# Patient Record
Sex: Male | Born: 1945 | Race: White | Hispanic: No | Marital: Married | Smoking: Former smoker
Health system: Southern US, Community
[De-identification: ages and names within clinical notes are randomized; demographics above are authoritative.]

## PROBLEM LIST (undated history)

## (undated) DIAGNOSIS — G4730 Sleep apnea, unspecified: Secondary | ICD-10-CM

## (undated) DIAGNOSIS — I10 Essential (primary) hypertension: Secondary | ICD-10-CM

## (undated) DIAGNOSIS — N400 Enlarged prostate without lower urinary tract symptoms: Secondary | ICD-10-CM

## (undated) DIAGNOSIS — E785 Hyperlipidemia, unspecified: Secondary | ICD-10-CM

## (undated) HISTORY — PX: SHOULDER ARTHROSCOPY: SHX128

## (undated) HISTORY — PX: HERNIA REPAIR: SHX51

## (undated) HISTORY — PX: COLONOSCOPY: SHX174

## (undated) HISTORY — PX: CHOLECYSTECTOMY: SHX55

---

## 2000-07-12 ENCOUNTER — Encounter: Payer: Self-pay | Admitting: Neurosurgery

## 2000-07-12 ENCOUNTER — Ambulatory Visit (HOSPITAL_COMMUNITY): Admission: RE | Admit: 2000-07-12 | Discharge: 2000-07-12 | Payer: Self-pay | Admitting: Neurosurgery

## 2006-10-09 ENCOUNTER — Ambulatory Visit: Payer: Self-pay | Admitting: Orthopedic Surgery

## 2006-10-31 ENCOUNTER — Ambulatory Visit: Payer: Self-pay | Admitting: Orthopedic Surgery

## 2006-11-06 ENCOUNTER — Ambulatory Visit: Payer: Self-pay | Admitting: Orthopedic Surgery

## 2007-07-14 ENCOUNTER — Ambulatory Visit: Payer: Self-pay | Admitting: Orthopedic Surgery

## 2008-01-04 ENCOUNTER — Ambulatory Visit: Payer: Self-pay | Admitting: Family Medicine

## 2011-06-19 ENCOUNTER — Ambulatory Visit: Payer: Self-pay | Admitting: Otolaryngology

## 2011-07-16 ENCOUNTER — Ambulatory Visit: Payer: Self-pay | Admitting: Otolaryngology

## 2011-10-11 ENCOUNTER — Ambulatory Visit: Payer: Self-pay | Admitting: General Surgery

## 2013-01-28 ENCOUNTER — Ambulatory Visit: Payer: Self-pay | Admitting: Internal Medicine

## 2014-10-05 ENCOUNTER — Ambulatory Visit: Payer: Self-pay | Admitting: Internal Medicine

## 2014-10-14 ENCOUNTER — Emergency Department: Payer: Self-pay | Admitting: Emergency Medicine

## 2018-11-03 DIAGNOSIS — M7540 Impingement syndrome of unspecified shoulder: Secondary | ICD-10-CM | POA: Insufficient documentation

## 2018-11-22 ENCOUNTER — Other Ambulatory Visit: Payer: Self-pay

## 2018-11-22 ENCOUNTER — Emergency Department: Payer: Federal, State, Local not specified - PPO

## 2018-11-22 ENCOUNTER — Emergency Department
Admission: EM | Admit: 2018-11-22 | Discharge: 2018-11-22 | Disposition: A | Discharge status: Home / Self Care | Payer: Federal, State, Local not specified - PPO | Attending: Emergency Medicine | Admitting: Emergency Medicine

## 2018-11-22 ENCOUNTER — Encounter: Payer: Self-pay | Admitting: Emergency Medicine

## 2018-11-22 DIAGNOSIS — S0003XA Contusion of scalp, initial encounter: Secondary | ICD-10-CM

## 2018-11-22 DIAGNOSIS — Y999 Unspecified external cause status: Secondary | ICD-10-CM | POA: Insufficient documentation

## 2018-11-22 DIAGNOSIS — W01198A Fall on same level from slipping, tripping and stumbling with subsequent striking against other object, initial encounter: Secondary | ICD-10-CM | POA: Insufficient documentation

## 2018-11-22 DIAGNOSIS — Y9389 Activity, other specified: Secondary | ICD-10-CM | POA: Insufficient documentation

## 2018-11-22 DIAGNOSIS — Z7982 Long term (current) use of aspirin: Secondary | ICD-10-CM | POA: Insufficient documentation

## 2018-11-22 DIAGNOSIS — R52 Pain, unspecified: Secondary | ICD-10-CM | POA: Insufficient documentation

## 2018-11-22 DIAGNOSIS — Y929 Unspecified place or not applicable: Secondary | ICD-10-CM | POA: Insufficient documentation

## 2018-11-22 DIAGNOSIS — S50312A Abrasion of left elbow, initial encounter: Secondary | ICD-10-CM | POA: Diagnosis not present

## 2018-11-22 DIAGNOSIS — S0001XA Abrasion of scalp, initial encounter: Secondary | ICD-10-CM | POA: Insufficient documentation

## 2018-11-22 DIAGNOSIS — W19XXXA Unspecified fall, initial encounter: Secondary | ICD-10-CM

## 2018-11-22 DIAGNOSIS — S0990XA Unspecified injury of head, initial encounter: Secondary | ICD-10-CM | POA: Diagnosis present

## 2018-11-22 DIAGNOSIS — T07XXXA Unspecified multiple injuries, initial encounter: Secondary | ICD-10-CM

## 2018-11-22 DIAGNOSIS — Y92009 Unspecified place in unspecified non-institutional (private) residence as the place of occurrence of the external cause: Secondary | ICD-10-CM

## 2018-11-22 HISTORY — DX: Hyperlipidemia, unspecified: E78.5

## 2018-11-22 MED ORDER — BACITRACIN-NEOMYCIN-POLYMYXIN 400-5-5000 EX OINT
TOPICAL_OINTMENT | Freq: Once | CUTANEOUS | Status: AC
  Administered 2018-11-22: 19:00:00 via TOPICAL

## 2018-11-22 NOTE — Discharge Instructions (Signed)
Your exam and CT scan are normal at this time. Take Tylenol as needed for pain. Apply a small of antibiotic ointment to promote healing of the abrasions. Follow-up with your provider as needed.

## 2018-11-22 NOTE — ED Notes (Signed)
Neosporin applied to left elbow and bandaged over top, pt tolerated well.

## 2018-11-22 NOTE — ED Notes (Signed)
Pt verbalized understanding of d/c instructions and f/u care. No further questions at this time. Pt ambulatory to the exit with steady gait.  

## 2018-11-22 NOTE — ED Notes (Signed)
Pt up to restroom.

## 2018-11-22 NOTE — ED Triage Notes (Signed)
Pt to ED via POV c/o fall. Pt did hit his head, pt states that he does not think he passed out. Pt states that he is not on blood thinners but he does take a "10 mg baby aspirin" every 3 days. Pt is in NAD at this time.

## 2018-11-22 NOTE — ED Provider Notes (Signed)
[facility] Emergency Department Provider Note ____________________________________________  Time seen: 12  I have reviewed the triage vital signs and the nursing notes.  HISTORY  Chief Complaint  Fall  HPI Austin Hunter is a 73 y.o. male presents to the ED for evaluation of injury sustained following a mechanical fall.  Patient was in the ER playing with his 66-year-old grandson, when he apparently went to chase down a tennis ball.  He describes slipping and falling, falling primarily to the left side.  He complains of pain to the left elbow, the left buttocks, and to the back of the head.  He denies any loss of consciousness, but is concerned because he hit his head very hard.  He reports no interim syncope, vision change, nausea, vomiting, or weakness for several hours since the fall.  He presents now for further evaluation, at the urging of his brother-in-law.  Past Medical History:  Diagnosis Date  . Hyperlipemia     There are no active problems to display for this patient.   Past Surgical History:  Procedure Laterality Date  . CHOLECYSTECTOMY    . HERNIA REPAIR      Prior to Admission medications   Not on File    Allergies Ciprofloxacin and Codeine  No family history on file.  Social History Social History   Tobacco Use  . Smoking status: Never Smoker  . Smokeless tobacco: Never Used  Substance Use Topics  . Alcohol use: Not Currently    Frequency: Never  . Drug use: Not Currently    Review of Systems  Constitutional: Negative for fever. Eyes: Negative for visual changes. ENT: Negative for sore throat. Cardiovascular: Negative for chest pain. Respiratory: Negative for shortness of breath. Gastrointestinal: Negative for abdominal pain, vomiting and diarrhea. Genitourinary: Negative for dysuria. Musculoskeletal: Negative for back pain. Skin: Negative for rash.  Multiple abrasions as above. Neurological: Negative for focal  weakness or numbness.  Mild headache following fall. ____________________________________________  PHYSICAL EXAM:  VITAL SIGNS: ED Triage Vitals  Enc Vitals Group     BP 11/22/18 1812 (!) 190/100     Pulse Rate 11/22/18 1810 62     Resp 11/22/18 1810 16     Temp 11/22/18 1810 97.8 F (36.6 C)     Temp Source 11/22/18 1810 Oral     SpO2 11/22/18 1810 95 %     Weight 11/22/18 1809 210 lb (95.3 kg)     Height 11/22/18 1809 5\' 10"  (1.778 m)     Head Circumference --      Peak Flow --      Pain Score 11/22/18 1809 3     Pain Loc --      Pain Edu? --      Excl. in GC? --     Constitutional: Alert and oriented. Well appearing and in no distress.  GCS = 15 Head: Normocephalic and atraumatic, except for a small abrasion to the crown of the scalp.  No battle sign noted. Eyes: Conjunctivae are normal. PERRL. Normal extraocular movements Neck: Supple.  Normal range of motion without crepitus.  No distracting midline tenderness is elicited. Cardiovascular: Normal rate, regular rhythm. Normal distal pulses. Respiratory: Normal respiratory effort. No wheezes/rales/rhonchi. Gastrointestinal: Soft and nontender. No distention. Musculoskeletal: Nontender with normal range of motion in all extremities.  Neurologic: Cranial nerves II through XII grossly intact.  Normal intrinsic and opposition testing noted.  Normal gait without ataxia. Normal speech and language. No gross focal neurologic deficits are  appreciated. Skin:  Skin is warm, dry and intact. No rash noted. Psychiatric: Mood and affect are normal. Patient exhibits appropriate insight and judgment. ____________________________________________   RADIOLOGY  Head CT w/o CM  Negative ____________________________________________  PROCEDURES  Procedures Neosporin ointment  ____________________________________________  INITIAL IMPRESSION / ASSESSMENT AND PLAN / ED COURSE  Patient with ED evaluation of injuries following a mechanical  fall. He sustained a head contusion and scalp abrasion. He is reassured by his normal exam and negative CT scan. He is to see his provider for ongoing symptoms.  ____________________________________________  FINAL CLINICAL IMPRESSION(S) / ED DIAGNOSES  Final diagnoses:  Fall in home, initial encounter  Contusion of scalp, initial encounter  Abrasions of multiple sites      Michaela Corner 11/22/18 2007    Sharman Cheek, MD 11/22/18 2243

## 2018-11-22 NOTE — ED Notes (Signed)
Patient transported to CT 

## 2018-11-22 NOTE — ED Notes (Signed)
Pt returned from ct

## 2019-04-14 ENCOUNTER — Other Ambulatory Visit: Payer: Self-pay | Admitting: Orthopedic Surgery

## 2019-04-16 ENCOUNTER — Other Ambulatory Visit
Admission: RE | Admit: 2019-04-16 | Discharge: 2019-04-16 | Disposition: A | Discharge status: Home / Self Care | Payer: Federal, State, Local not specified - PPO | Source: Ambulatory Visit | Attending: Orthopedic Surgery | Admitting: Orthopedic Surgery

## 2019-04-16 ENCOUNTER — Other Ambulatory Visit: Payer: Self-pay

## 2019-04-16 DIAGNOSIS — Z01812 Encounter for preprocedural laboratory examination: Secondary | ICD-10-CM | POA: Diagnosis present

## 2019-04-16 DIAGNOSIS — Z20828 Contact with and (suspected) exposure to other viral communicable diseases: Secondary | ICD-10-CM | POA: Diagnosis not present

## 2019-04-17 LAB — SARS CORONAVIRUS 2 (TAT 6-24 HRS): SARS Coronavirus 2: NEGATIVE

## 2019-04-19 ENCOUNTER — Other Ambulatory Visit: Payer: Self-pay

## 2019-04-19 ENCOUNTER — Encounter
Admission: RE | Admit: 2019-04-19 | Discharge: 2019-04-19 | Disposition: A | Discharge status: Home / Self Care | Payer: Federal, State, Local not specified - PPO | Source: Ambulatory Visit | Attending: Orthopedic Surgery | Admitting: Orthopedic Surgery

## 2019-04-19 DIAGNOSIS — R001 Bradycardia, unspecified: Secondary | ICD-10-CM | POA: Insufficient documentation

## 2019-04-19 DIAGNOSIS — Z01818 Encounter for other preprocedural examination: Secondary | ICD-10-CM | POA: Diagnosis present

## 2019-04-19 DIAGNOSIS — I10 Essential (primary) hypertension: Secondary | ICD-10-CM | POA: Insufficient documentation

## 2019-04-19 HISTORY — DX: Essential (primary) hypertension: I10

## 2019-04-19 HISTORY — DX: Sleep apnea, unspecified: G47.30

## 2019-04-19 HISTORY — DX: Benign prostatic hyperplasia without lower urinary tract symptoms: N40.0

## 2019-04-19 LAB — BASIC METABOLIC PANEL WITH GFR
Anion gap: 10 (ref 5–15)
BUN: 22 mg/dL (ref 8–23)
CO2: 24 mmol/L (ref 22–32)
Calcium: 9.7 mg/dL (ref 8.9–10.3)
Chloride: 103 mmol/L (ref 98–111)
Creatinine, Ser: 1.04 mg/dL (ref 0.61–1.24)
GFR calc Af Amer: >60 mL/min
GFR calc non Af Amer: >60 mL/min
Glucose, Bld: 131 mg/dL — ABNORMAL HIGH (ref 70–99)
Potassium: 4 mmol/L (ref 3.5–5.1)
Sodium: 137 mmol/L (ref 135–145)

## 2019-04-19 LAB — CBC WITH DIFFERENTIAL/PLATELET
Abs Immature Granulocytes: 0.05 K/uL (ref 0.00–0.07)
Basophils Absolute: 0.1 K/uL (ref 0.0–0.1)
Basophils Relative: 1 %
Eosinophils Absolute: 0.2 K/uL (ref 0.0–0.5)
Eosinophils Relative: 2 %
HCT: 41.7 % (ref 39.0–52.0)
Hemoglobin: 13.7 g/dL (ref 13.0–17.0)
Immature Granulocytes: 1 %
Lymphocytes Relative: 27 %
Lymphs Abs: 1.9 K/uL (ref 0.7–4.0)
MCH: 31.1 pg (ref 26.0–34.0)
MCHC: 32.9 g/dL (ref 30.0–36.0)
MCV: 94.6 fL (ref 80.0–100.0)
Monocytes Absolute: 0.8 K/uL (ref 0.1–1.0)
Monocytes Relative: 11 %
Neutro Abs: 4.2 K/uL (ref 1.7–7.7)
Neutrophils Relative %: 58 %
Platelets: 268 K/uL (ref 150–400)
RBC: 4.41 MIL/uL (ref 4.22–5.81)
RDW: 13.8 % (ref 11.5–15.5)
WBC: 7.2 K/uL (ref 4.0–10.5)
nRBC: 0 % (ref 0.0–0.2)

## 2019-04-19 LAB — PROTIME-INR
INR: 1.1 (ref 0.8–1.2)
Prothrombin Time: 14.2 s (ref 11.4–15.2)

## 2019-04-19 LAB — APTT: aPTT: 27 s (ref 24–36)

## 2019-04-19 MED ORDER — CEFAZOLIN SODIUM-DEXTROSE 2-4 GM/100ML-% IV SOLN
2.0000 g | INTRAVENOUS | Status: AC
  Administered 2019-04-20: 2 g via INTRAVENOUS

## 2019-04-19 NOTE — Patient Instructions (Addendum)
Your procedure is scheduled on: 04/20/19 Report to [facility] at 12:30. To find out your arrival time please call [phone] between 1PM - 3PM on today .  Remember: Instructions that are not followed completely may result in serious medical risk, up to and including death, or upon the discretion of your surgeon and anesthesiologist your surgery may need to be rescheduled.     _X__ 1. Do not eat food after midnight the night before your procedure.                 No gum chewing or hard candies. You may drink clear liquids up to 2 hours                 before you are scheduled to arrive for your surgery- DO not drink clear                 liquids within 2 hours of the start of your surgery.                 Clear Liquids include:  water, apple juice without pulp, clear carbohydrate                 drink such as Clearfast or Gatorade, Black Coffee or Tea (Do not add                 anything to coffee or tea). Diabetics water only  __X__2.  On the morning of surgery brush your teeth with toothpaste and water, you                 may rinse your mouth with mouthwash if you wish.  Do not swallow any              toothpaste of mouthwash.     _X__ 3.  No Alcohol for 24 hours before or after surgery.   _X__ 4.  Do Not Smoke or use e-cigarettes For 24 Hours Prior to Your Surgery.                 Do not use any chewable tobacco products for at least 6 hours prior to                 surgery.  ____  5.  Bring all medications with you on the day of surgery if instructed.   __X__  6.  Notify your doctor if there is any change in your medical condition      (cold, fever, infections).     Do not wear jewelry, make-up, hairpins, clips or nail polish. Do not wear lotions, powders, or perfumes.  Do not shave 48 hours prior to surgery. Men may shave face and neck. Do not bring valuables to the hospital.    [facility] is not responsible for any  belongings or valuables.  Contacts, dentures/partials or body piercings may not be worn into surgery. Bring a case for your contacts, glasses or hearing aids, a denture cup will be supplied. Leave your suitcase in the car. After surgery it may be brought to your room. For patients admitted to the hospital, discharge time is determined by your treatment team.   Patients discharged the day of surgery will not be allowed to drive home.   Please read over the following fact sheets that you were given:   MRSA Information  __X__ Take these medicines the morning of surgery with A SIP OF  WATER:    1. none  2.   3.   4.  5.  6.  ____ Fleet Enema (as directed)   __X__ Use CHG Soap/SAGE wipes as directed  ____ Use inhalers on the day of surgery  ____ Stop metformin/Janumet/Farxiga 2 days prior to surgery    ____ Take 1/2 of usual insulin dose the night before surgery. No insulin the morning          of surgery.   ____ Stop Blood Thinners Coumadin/Plavix/Xarelto/Pleta/Pradaxa/Eliquis/Effient/Aspirin  on   Or contact your Surgeon, Cardiologist or Medical Doctor regarding  ability to stop your blood thinners  __X__ Stop Anti-inflammatories 7 days before surgery such as Advil, Ibuprofen, Motrin,  BC or Goodies Powder, Naprosyn, Naproxen, Aleve, Aspirin    __X__ Stop all herbal supplements, fish oil or vitamin E until after surgery.    ____ Bring C-Pap to the hospital.

## 2019-04-20 ENCOUNTER — Ambulatory Visit
Admission: RE | Admit: 2019-04-20 | Discharge: 2019-04-20 | Disposition: A | Discharge status: Home / Self Care | Payer: Federal, State, Local not specified - PPO | Source: Ambulatory Visit | Attending: Orthopedic Surgery | Admitting: Orthopedic Surgery

## 2019-04-20 ENCOUNTER — Ambulatory Visit: Payer: Federal, State, Local not specified - PPO

## 2019-04-20 ENCOUNTER — Ambulatory Visit: Payer: Federal, State, Local not specified - PPO | Admitting: Anesthesiology

## 2019-04-20 ENCOUNTER — Encounter: Payer: Self-pay | Admitting: *Deleted

## 2019-04-20 ENCOUNTER — Encounter
Admission: RE | Disposition: A | Discharge status: Home / Self Care | Payer: Self-pay | Source: Ambulatory Visit | Attending: Orthopedic Surgery

## 2019-04-20 DIAGNOSIS — M25812 Other specified joint disorders, left shoulder: Secondary | ICD-10-CM | POA: Diagnosis not present

## 2019-04-20 DIAGNOSIS — X58XXXA Exposure to other specified factors, initial encounter: Secondary | ICD-10-CM | POA: Diagnosis not present

## 2019-04-20 DIAGNOSIS — N400 Enlarged prostate without lower urinary tract symptoms: Secondary | ICD-10-CM | POA: Diagnosis not present

## 2019-04-20 DIAGNOSIS — Z885 Allergy status to narcotic agent status: Secondary | ICD-10-CM | POA: Insufficient documentation

## 2019-04-20 DIAGNOSIS — J449 Chronic obstructive pulmonary disease, unspecified: Secondary | ICD-10-CM | POA: Diagnosis not present

## 2019-04-20 DIAGNOSIS — S46112A Strain of muscle, fascia and tendon of long head of biceps, left arm, initial encounter: Secondary | ICD-10-CM | POA: Diagnosis not present

## 2019-04-20 DIAGNOSIS — E785 Hyperlipidemia, unspecified: Secondary | ICD-10-CM | POA: Insufficient documentation

## 2019-04-20 DIAGNOSIS — I10 Essential (primary) hypertension: Secondary | ICD-10-CM | POA: Diagnosis not present

## 2019-04-20 DIAGNOSIS — M19012 Primary osteoarthritis, left shoulder: Secondary | ICD-10-CM | POA: Insufficient documentation

## 2019-04-20 DIAGNOSIS — G4730 Sleep apnea, unspecified: Secondary | ICD-10-CM | POA: Insufficient documentation

## 2019-04-20 DIAGNOSIS — Z79899 Other long term (current) drug therapy: Secondary | ICD-10-CM | POA: Insufficient documentation

## 2019-04-20 DIAGNOSIS — Z87891 Personal history of nicotine dependence: Secondary | ICD-10-CM | POA: Insufficient documentation

## 2019-04-20 DIAGNOSIS — M75112 Incomplete rotator cuff tear or rupture of left shoulder, not specified as traumatic: Secondary | ICD-10-CM | POA: Insufficient documentation

## 2019-04-20 DIAGNOSIS — Z7982 Long term (current) use of aspirin: Secondary | ICD-10-CM | POA: Diagnosis not present

## 2019-04-20 DIAGNOSIS — Z881 Allergy status to other antibiotic agents status: Secondary | ICD-10-CM | POA: Diagnosis not present

## 2019-04-20 HISTORY — PX: SHOULDER ARTHROSCOPY WITH OPEN ROTATOR CUFF REPAIR: SHX6092

## 2019-04-20 SURGERY — ARTHROSCOPY, SHOULDER WITH REPAIR, ROTATOR CUFF, OPEN
Anesthesia: General | Site: Shoulder | Laterality: Left

## 2019-04-20 MED ORDER — BUPIVACAINE HCL (PF) 0.5 % IJ SOLN
INTRAMUSCULAR | Status: AC
  Filled 2019-04-20: qty 10

## 2019-04-20 MED ORDER — MIDAZOLAM HCL 2 MG/2ML IJ SOLN
1.0000 mg | Freq: Once | INTRAMUSCULAR | Status: AC
  Administered 2019-04-20: 13:00:00 1 mg via INTRAVENOUS

## 2019-04-20 MED ORDER — CHLORHEXIDINE GLUCONATE CLOTH 2 % EX PADS: 6.0000 | MEDICATED_PAD | Freq: Once | CUTANEOUS | Status: DC

## 2019-04-20 MED ORDER — OXYCODONE HCL 5 MG PO TABS: 5.0000 mg | ORAL_TABLET | Freq: Once | ORAL | Status: DC | PRN

## 2019-04-20 MED ORDER — BUPIVACAINE LIPOSOME 1.3 % IJ SUSP
INTRAMUSCULAR | Status: DC | PRN
  Administered 2019-04-20: 13 mL via PERINEURAL
  Administered 2019-04-20: 7 mL via PERINEURAL

## 2019-04-20 MED ORDER — EPINEPHRINE PF 1 MG/ML IJ SOLN
INTRAMUSCULAR | Status: AC
  Filled 2019-04-20: qty 4

## 2019-04-20 MED ORDER — MIDAZOLAM HCL 2 MG/2ML IJ SOLN
INTRAMUSCULAR | Status: AC
  Administered 2019-04-20: 1 mg via INTRAVENOUS
  Filled 2019-04-20: qty 2

## 2019-04-20 MED ORDER — FENTANYL CITRATE (PF) 100 MCG/2ML IJ SOLN
INTRAMUSCULAR | Status: AC
  Filled 2019-04-20: qty 2

## 2019-04-20 MED ORDER — FAMOTIDINE 20 MG PO TABS
ORAL_TABLET | ORAL | Status: AC
  Filled 2019-04-20: qty 1

## 2019-04-20 MED ORDER — PHENYLEPHRINE HCL (PRESSORS) 10 MG/ML IV SOLN
INTRAVENOUS | Status: AC
  Filled 2019-04-20: qty 1

## 2019-04-20 MED ORDER — FENTANYL CITRATE (PF) 100 MCG/2ML IJ SOLN: 25.0000 ug | INTRAMUSCULAR | Status: DC | PRN

## 2019-04-20 MED ORDER — FENTANYL CITRATE (PF) 100 MCG/2ML IJ SOLN
INTRAMUSCULAR | Status: DC | PRN
  Administered 2019-04-20 (×2): 25 ug via INTRAVENOUS
  Administered 2019-04-20: 50 ug via INTRAVENOUS

## 2019-04-20 MED ORDER — DEXAMETHASONE SODIUM PHOSPHATE 10 MG/ML IJ SOLN
INTRAMUSCULAR | Status: DC | PRN
  Administered 2019-04-20: 10 mg via INTRAVENOUS

## 2019-04-20 MED ORDER — DEXAMETHASONE SODIUM PHOSPHATE 10 MG/ML IJ SOLN
INTRAMUSCULAR | Status: AC
  Filled 2019-04-20: qty 1

## 2019-04-20 MED ORDER — ONDANSETRON HCL 4 MG/2ML IJ SOLN
INTRAMUSCULAR | Status: DC | PRN
  Administered 2019-04-20: 4 mg via INTRAVENOUS

## 2019-04-20 MED ORDER — SODIUM CHLORIDE 0.9 % IV SOLN
INTRAVENOUS | Status: DC | PRN
  Administered 2019-04-20: 50 ug/min via INTRAVENOUS

## 2019-04-20 MED ORDER — LACTATED RINGERS IV SOLN
INTRAVENOUS | Status: DC
  Administered 2019-04-20: 13:00:00 via INTRAVENOUS

## 2019-04-20 MED ORDER — FENTANYL CITRATE (PF) 100 MCG/2ML IJ SOLN
INTRAMUSCULAR | Status: AC
  Administered 2019-04-20: 50 ug via INTRAVENOUS
  Filled 2019-04-20: qty 2

## 2019-04-20 MED ORDER — ONDANSETRON HCL 4 MG/2ML IJ SOLN
INTRAMUSCULAR | Status: AC
  Filled 2019-04-20: qty 2

## 2019-04-20 MED ORDER — LIDOCAINE HCL (PF) 1 % IJ SOLN
INTRAMUSCULAR | Status: AC
  Filled 2019-04-20: qty 5

## 2019-04-20 MED ORDER — SUGAMMADEX SODIUM 200 MG/2ML IV SOLN
INTRAVENOUS | Status: AC
  Filled 2019-04-20: qty 2

## 2019-04-20 MED ORDER — FAMOTIDINE 20 MG PO TABS
20.0000 mg | ORAL_TABLET | Freq: Once | ORAL | Status: AC
  Administered 2019-04-20: 20 mg via ORAL

## 2019-04-20 MED ORDER — ONDANSETRON HCL 4 MG PO TABS: 4.0000 mg | ORAL_TABLET | Freq: Three times a day (TID) | ORAL | 0 refills | Status: AC | PRN

## 2019-04-20 MED ORDER — NEOMYCIN-POLYMYXIN B GU 40-200000 IR SOLN
Status: AC
  Filled 2019-04-20: qty 2

## 2019-04-20 MED ORDER — FENTANYL CITRATE (PF) 100 MCG/2ML IJ SOLN
50.0000 ug | Freq: Once | INTRAMUSCULAR | Status: AC
  Administered 2019-04-20: 13:00:00 50 ug via INTRAVENOUS

## 2019-04-20 MED ORDER — OXYCODONE HCL 5 MG/5ML PO SOLN: 5.0000 mg | Freq: Once | ORAL | Status: DC | PRN

## 2019-04-20 MED ORDER — PROPOFOL 10 MG/ML IV BOLUS
INTRAVENOUS | Status: AC
  Filled 2019-04-20: qty 20

## 2019-04-20 MED ORDER — BUPIVACAINE HCL (PF) 0.5 % IJ SOLN
INTRAMUSCULAR | Status: DC | PRN
  Administered 2019-04-20: 3 mL via PERINEURAL
  Administered 2019-04-20: 7 mL via PERINEURAL

## 2019-04-20 MED ORDER — LIDOCAINE HCL (CARDIAC) PF 100 MG/5ML IV SOSY
PREFILLED_SYRINGE | INTRAVENOUS | Status: DC | PRN
  Administered 2019-04-20: 100 mg via INTRAVENOUS

## 2019-04-20 MED ORDER — ROCURONIUM BROMIDE 50 MG/5ML IV SOLN
INTRAVENOUS | Status: AC
  Filled 2019-04-20: qty 1

## 2019-04-20 MED ORDER — GLYCOPYRROLATE 0.2 MG/ML IJ SOLN
INTRAMUSCULAR | Status: DC | PRN
  Administered 2019-04-20: 0.2 mg via INTRAVENOUS

## 2019-04-20 MED ORDER — BUPIVACAINE LIPOSOME 1.3 % IJ SUSP
INTRAMUSCULAR | Status: AC
  Filled 2019-04-20: qty 20

## 2019-04-20 MED ORDER — OXYCODONE HCL 5 MG PO TABS: 5.0000 mg | ORAL_TABLET | ORAL | 0 refills | Status: AC | PRN

## 2019-04-20 MED ORDER — PHENYLEPHRINE HCL (PRESSORS) 10 MG/ML IV SOLN
INTRAVENOUS | Status: DC | PRN
  Administered 2019-04-20: 100 ug via INTRAVENOUS

## 2019-04-20 MED ORDER — ROCURONIUM BROMIDE 100 MG/10ML IV SOLN
INTRAVENOUS | Status: DC | PRN
  Administered 2019-04-20: 50 mg via INTRAVENOUS
  Administered 2019-04-20: 20 mg via INTRAVENOUS

## 2019-04-20 MED ORDER — PROPOFOL 10 MG/ML IV BOLUS
INTRAVENOUS | Status: DC | PRN
  Administered 2019-04-20: 140 mg via INTRAVENOUS

## 2019-04-20 MED ORDER — SUGAMMADEX SODIUM 200 MG/2ML IV SOLN
INTRAVENOUS | Status: DC | PRN
  Administered 2019-04-20: 200 mg via INTRAVENOUS

## 2019-04-20 MED ORDER — ACETAMINOPHEN 10 MG/ML IV SOLN
INTRAVENOUS | Status: AC
  Filled 2019-04-20: qty 100

## 2019-04-20 MED ORDER — LIDOCAINE HCL (PF) 2 % IJ SOLN
INTRAMUSCULAR | Status: AC
  Filled 2019-04-20: qty 10

## 2019-04-20 MED ORDER — ACETAMINOPHEN 10 MG/ML IV SOLN
INTRAVENOUS | Status: DC | PRN
  Administered 2019-04-20: 1000 mg via INTRAVENOUS

## 2019-04-20 MED ORDER — CEFAZOLIN SODIUM-DEXTROSE 2-4 GM/100ML-% IV SOLN
INTRAVENOUS | Status: AC
  Filled 2019-04-20: qty 100

## 2019-04-20 SURGICAL SUPPLY — 75 items
ADAPTER IRRIG TUBE 2 SPIKE SOL (ADAPTER) ×4 IMPLANT
ADPR TBG 2 SPK PMP STRL ASCP (ADAPTER) ×2
ANCH SUT 5.5 KNTLS PEEK (Orthopedic Implant) ×3 IMPLANT
ANCH SUT Q-FX 2.8 (Anchor) ×2 IMPLANT
ANCHOR ALL-SUT Q-FIX 2.8 (Anchor) ×2 IMPLANT
ANCHOR SUT 5.5 MULTIFIX (Orthopedic Implant) ×3 IMPLANT
BUR RADIUS 4.0X18.5 (BURR) ×2 IMPLANT
BUR RADIUS 5.5 (BURR) ×2 IMPLANT
CANNULA 5.75X7 CRYSTAL CLEAR (CANNULA) ×4 IMPLANT
CANNULA PARTIAL THREAD 2X7 (CANNULA) ×2 IMPLANT
CANNULA TWIST IN 8.25X9CM (CANNULA) IMPLANT
CONNECTOR PERFECT PASSER (CONNECTOR) ×1 IMPLANT
COOLER POLAR GLACIER W/PUMP (MISCELLANEOUS) ×2 IMPLANT
COVER WAND RF STERILE (DRAPES) ×2 IMPLANT
CRADLE LAMINECT ARM (MISCELLANEOUS) ×2 IMPLANT
DEVICE SUCT BLK HOLE OR FLOOR (MISCELLANEOUS) ×1 IMPLANT
DRAPE 3/4 80X56 (DRAPES) ×2 IMPLANT
DRAPE INCISE IOBAN 66X45 STRL (DRAPES) ×2 IMPLANT
DRAPE SPLIT 6X30 W/TAPE (DRAPES) ×4 IMPLANT
DRAPE U-SHAPE 47X51 STRL (DRAPES) IMPLANT
DURAPREP 26ML APPLICATOR (WOUND CARE) ×6 IMPLANT
ELECT REM PT RETURN 9FT ADLT (ELECTROSURGICAL) ×2
ELECTRODE REM PT RTRN 9FT ADLT (ELECTROSURGICAL) ×1 IMPLANT
GAUZE SPONGE 4X4 12PLY STRL (GAUZE/BANDAGES/DRESSINGS) ×4 IMPLANT
GAUZE XEROFORM 1X8 LF (GAUZE/BANDAGES/DRESSINGS) ×2 IMPLANT
GLOVE BIOGEL PI IND STRL 9 (GLOVE) ×1 IMPLANT
GLOVE BIOGEL PI INDICATOR 9 (GLOVE) ×1
GLOVE SURG 9.0 ORTHO LTXF (GLOVE) ×4 IMPLANT
GOWN STRL REUS TWL 2XL XL LVL4 (GOWN DISPOSABLE) ×2 IMPLANT
GOWN STRL REUS W/ TWL LRG LVL3 (GOWN DISPOSABLE) ×1 IMPLANT
GOWN STRL REUS W/ TWL LRG LVL4 (GOWN DISPOSABLE) ×1 IMPLANT
GOWN STRL REUS W/TWL LRG LVL3 (GOWN DISPOSABLE) ×2
GOWN STRL REUS W/TWL LRG LVL4 (GOWN DISPOSABLE) ×2
IV LACTATED RINGER IRRG 3000ML (IV SOLUTION) ×14
IV LR IRRIG 3000ML ARTHROMATIC (IV SOLUTION) ×6 IMPLANT
KIT STABILIZATION SHOULDER (MISCELLANEOUS) ×2 IMPLANT
KIT SUTURE 2.8 Q-FIX DISP (MISCELLANEOUS) ×1 IMPLANT
KIT SUTURETAK 3.0 INSERT PERC (KITS) IMPLANT
KIT TURNOVER KIT A (KITS) ×2 IMPLANT
MANIFOLD NEPTUNE II (INSTRUMENTS) ×2 IMPLANT
MASK FACE SPIDER DISP (MASK) ×2 IMPLANT
MAT ABSORB  FLUID 56X50 GRAY (MISCELLANEOUS) ×2
MAT ABSORB FLUID 56X50 GRAY (MISCELLANEOUS) ×2 IMPLANT
NDL SAFETY ECLIPSE 18X1.5 (NEEDLE) ×1 IMPLANT
NEEDLE HYPO 18GX1.5 SHARP (NEEDLE) ×2
NEEDLE HYPO 22GX1.5 SAFETY (NEEDLE) ×2 IMPLANT
NS IRRIG 500ML POUR BTL (IV SOLUTION) ×2 IMPLANT
PACK ARTHROSCOPY SHOULDER (MISCELLANEOUS) ×2 IMPLANT
PAD WRAPON POLAR SHDR XLG (MISCELLANEOUS) ×1 IMPLANT
PASSER SUT CAPTURE FIRST (SUTURE) IMPLANT
PASSER SUT FIRSTPASS SELF (INSTRUMENTS) ×1 IMPLANT
SET TUBE SUCT SHAVER OUTFL 24K (TUBING) ×2 IMPLANT
SET TUBE TIP INTRA-ARTICULAR (MISCELLANEOUS) ×2 IMPLANT
STRAP SAFETY 5IN WIDE (MISCELLANEOUS) ×2 IMPLANT
STRIP CLOSURE SKIN 1/2X4 (GAUZE/BANDAGES/DRESSINGS) ×4 IMPLANT
SUT ETHILON 4-0 (SUTURE) ×2
SUT ETHILON 4-0 FS2 18XMFL BLK (SUTURE) ×1
SUT LASSO 90 DEG SD STR (SUTURE) IMPLANT
SUT MNCRL 4-0 (SUTURE) ×2
SUT MNCRL 4-0 27XMFL (SUTURE) ×1
SUT PDS AB 0 CT1 27 (SUTURE) ×2 IMPLANT
SUT PERFECTPASSER WHITE CART (SUTURE) ×4 IMPLANT
SUT SMART STITCH CARTRIDGE (SUTURE) ×4 IMPLANT
SUT VIC AB 0 CT1 36 (SUTURE) ×2 IMPLANT
SUT VIC AB 2-0 CT2 27 (SUTURE) ×2 IMPLANT
SUTURE ETHLN 4-0 FS2 18XMF BLK (SUTURE) ×1 IMPLANT
SUTURE MAGNUM WIRE 2X48 BLK (SUTURE) IMPLANT
SUTURE MNCRL 4-0 27XMF (SUTURE) ×1 IMPLANT
SYR 10ML LL (SYRINGE) ×2 IMPLANT
TAPE MICROFOAM 4IN (TAPE) ×2 IMPLANT
TUBING ARTHRO INFLOW-ONLY STRL (TUBING) ×2 IMPLANT
TUBING CONNECTING 10 (TUBING) ×2 IMPLANT
WAND HAND CNTRL MULTIVAC 90 (MISCELLANEOUS) ×2 IMPLANT
WIRE MAGNUM (SUTURE) ×1 IMPLANT
WRAPON POLAR PAD SHDR XLG (MISCELLANEOUS) ×2

## 2019-04-20 NOTE — Discharge Instructions (Signed)

## 2019-04-20 NOTE — H&P (Signed)
PREOPERATIVE H&P  Chief Complaint: M75.112 incomplete rotator cuff tear or rupture of left shoulder  HPI: Austin Hunter is a 73 y.o. male who presents for preoperative history and physical with a diagnosis of M75.112 incomplete rotator cuff tear or rupture of left shoulder. Symptoms of pain, weakness and limited ROM are significantly impairing activities of daily living.  He has failed non-op management with PT, NSAIDs and steroid injection.  His MRI shows a partial thickness rotator cuff tear.  He has agreed with surgical repair of the rotator cuff.   Past Medical History:  Diagnosis Date  . Enlarged prostate   . Hyperlipemia   . Hypertension   . Sleep apnea    Past Surgical History:  Procedure Laterality Date  . CHOLECYSTECTOMY    . COLONOSCOPY    . HERNIA REPAIR    . SHOULDER ARTHROSCOPY Left    Social History   Socioeconomic History  . Marital status: Married    Spouse name: Not on file  . Number of children: Not on file  . Years of education: Not on file  . Highest education level: Not on file  Occupational History  . Not on file  Social Needs  . Financial resource strain: Not on file  . Food insecurity    Worry: Not on file    Inability: Not on file  . Transportation needs    Medical: Not on file    Non-medical: Not on file  Tobacco Use  . Smoking status: Former Smoker    Quit date: 1985    Years since quitting: 35.6  . Smokeless tobacco: Never Used  Substance and Sexual Activity  . Alcohol use: Not Currently    Frequency: Never  . Drug use: Never  . Sexual activity: Not on file  Lifestyle  . Physical activity    Days per week: Not on file    Minutes per session: Not on file  . Stress: Not on file  Relationships  . Social Musicianconnections    Talks on phone: Not on file    Gets together: Not on file    Attends religious service: Not on file    Active member of club or organization: Not on file    Attends meetings of clubs or organizations: Not on file     Relationship status: Not on file  Other Topics Concern  . Not on file  Social History Narrative  . Not on file   History reviewed. No pertinent family history. Allergies  Allergen Reactions  . Ciprofloxacin Hives  . Codeine Nausea Only   Prior to Admission medications   Medication Sig Start Date End Date Taking? Authorizing Provider  acetaminophen (TYLENOL) 500 MG tablet Take 500 mg by mouth every 6 (six) hours as needed for moderate pain or headache.   Yes [provider]  aspirin EC 81 MG tablet Take 81 mg by mouth every 3 (three) days.   Yes [provider]  fenofibrate (TRICOR) 145 MG tablet Take 145 mg by mouth at bedtime.   Yes [provider]  ibuprofen (ADVIL) 200 MG tablet Take 200 mg by mouth every 6 (six) hours as needed for headache or moderate pain.   Yes [provider]  lisinopril (ZESTRIL) 10 MG tablet Take 10 mg by mouth daily.   Yes [provider]  Misc Natural Products (PROSTATE) CAPS Take 1 capsule by mouth 2 (two) times daily. Prosta-Strong   Yes [provider]  Omega-3 Fatty Acids (OMEGA 3 PO)  Take 1 capsule by mouth every other day.   Yes [provider]  OVER THE COUNTER MEDICATION Take 7 capsules by mouth daily. Melaleuca peek performance heart health vitamin package   Yes [provider]  OVER THE COUNTER MEDICATION Take 1 tablet by mouth at bedtime. Immuno shield otc supplement   Yes [provider]  tamsulosin (FLOMAX) 0.4 MG CAPS capsule Take 0.4 mg by mouth at bedtime.   Yes [provider]  Vitamin Mixture (ESTER-C PO) Take 1 tablet by mouth at bedtime.   Yes [provider]     Positive ROS: All other systems have been reviewed and were otherwise negative with the exception of those mentioned in the HPI and as above.  Physical Exam: General: Alert, no acute distress Cardiovascular: Regular rate and rhythm, no murmurs rubs or gallops.  No pedal  edema Respiratory: Clear to auscultation bilaterally, no wheezes rales or rhonchi. No cyanosis, no use of accessory musculature GI: No organomegaly, abdomen is soft and non-tender nondistended with positive bowel sounds. Skin: Skin intact, no lesions within the operative field. Neurologic: Sensation intact distally Psychiatric: Patient is competent for consent with normal mood and affect Lymphatic: No cervical lymphadenopathy  MUSCULOSKELETAL: Pain with forward elevation and abduction to 90 degrees.  His skin is intact.  There is no erythema ecchymosis or swelling.  Patient had an axillary rash without drainage or opening of the skin.  Patient has full digital wrist normal range of motion, intact sensation light touch and palpable pedal pulses.  Positive impingement signs but no apprehension or instability.  Patient had weakness of shoulder abduction and external rotation.  Assessment: M75.112 incomplete rotator cuff tear or rupture of left shoulder  Plan: Plan for Procedure(s): LEFT SHOULDER ARTHROSCOPIC SUBACROMIAL DECOMPRESSION, DISTAL CLAVICLE EXCISION AND MINI-OPEN ROTATOR CUFF REPAIR WITH POSSIBLE BICEPS TENOTOMY  I reviewed the details of the operation as well as the postoperative course with the patient.  He understands that his MRI shows a partial tear of the rotator cuff.  He has significant weakness pain and limited range of motion clinically.  The plan is to repair his partial rotator cuff tear and perform a subacromial decompression and distal clavicle excision.  His biceps tendon will be evaluated during surgery and a tenotomy versus tenodesis performed if necessary.  I discussed the risks and benefits of surgery.  The patient understands the risks include but are not limited to infection, bleeding, nerve or blood vessel injury, joint stiffness or loss of motion, persistent pain, weakness or instability, re-tear of the rotator cuff, failure of the repair and hardware failure and the  need for further surgery. Medical risks include but are not limited to DVT and pulmonary embolism, myocardial infarction, stroke, pneumonia, respiratory failure and death. Patient understood these risks and wished to proceed.     Thornton Park, MD   04/20/2019 2:14 PM

## 2019-04-20 NOTE — Anesthesia Procedure Notes (Signed)
Anesthesia Regional Block: Interscalene brachial plexus block   Pre-Anesthetic Checklist: ,, timeout performed, Correct Patient, Correct Site, Correct Laterality, Correct Procedure, Correct Position, site marked, Risks and benefits discussed,  Surgical consent,  Pre-op evaluation,  At surgeon's request and post-op pain management  Laterality: Upper and Left  Prep: chloraprep       Needles:  Injection technique: Single-shot  Needle Type: Stimiplex     Needle Length: 5cm  Needle Gauge: 22     Additional Needles:   Procedures:,,,, ultrasound used (permanent image in chart),,,,  Narrative:  Start time: 04/20/2019 1:30 PM End time: 04/20/2019 1:33 PM Injection made incrementally with aspirations every 5 mL.  Performed by: Personally  Anesthesiologist: Piscitello, Precious Haws, MD  Additional Notes: Patient consented for risk and benefits of nerve block including but not limited to nerve damage, failed block, bleeding and infection.  Patient voiced understanding.  Functioning IV was confirmed and monitors were applied.  A 41mm 22ga Stimuplex needle was used. Sterile prep,hand hygiene and sterile gloves were used.  Minimal sedation used for procedure.  No paresthesia endorsed by patient during the procedure.  Negative aspiration and negative test dose prior to incremental administration of local anesthetic. The patient tolerated the procedure well with no immediate complications.

## 2019-04-20 NOTE — Anesthesia Preprocedure Evaluation (Signed)
Anesthesia Evaluation  Patient identified by MRN, date of birth, ID band Patient awake    Reviewed: Allergy & Precautions, H&P , NPO status , Patient's Chart, lab work & pertinent test results  History of Anesthesia Complications Negative for: history of anesthetic complications  Airway Mallampati: II  TM Distance: >3 FB Neck ROM: limited    Dental  (+) Chipped, Poor Dentition   Pulmonary neg shortness of breath, sleep apnea and Continuous Positive Airway Pressure Ventilation , COPD, former smoker,           Cardiovascular Exercise Tolerance: Good hypertension, (-) angina(-) Past MI and (-) DOE      Neuro/Psych negative neurological ROS  negative psych ROS   GI/Hepatic negative GI ROS, Neg liver ROS, neg GERD  ,  Endo/Other  negative endocrine ROS  Renal/GU      Musculoskeletal   Abdominal   Peds  Hematology negative hematology ROS (+)   Anesthesia Other Findings Past Medical History: No date: Enlarged prostate No date: Hyperlipemia No date: Hypertension No date: Sleep apnea  Past Surgical History: No date: CHOLECYSTECTOMY No date: COLONOSCOPY No date: HERNIA REPAIR No date: SHOULDER ARTHROSCOPY; Left  BMI    Body Mass Index: 28.98 kg/m      Reproductive/Obstetrics negative OB ROS                             Anesthesia Physical Anesthesia Plan  ASA: III  Anesthesia Plan: General ETT   Post-op Pain Management: GA combined w/ Regional for post-op pain   Induction: Intravenous  PONV Risk Score and Plan: Ondansetron, Dexamethasone, Midazolam and Treatment may vary due to age or medical condition  Airway Management Planned: Oral ETT  Additional Equipment:   Intra-op Plan:   Post-operative Plan: Extubation in OR  Informed Consent: I have reviewed the patients History and Physical, chart, labs and discussed the procedure including the risks, benefits and alternatives  for the proposed anesthesia with the patient or authorized representative who has indicated his/her understanding and acceptance.     Dental Advisory Given  Plan Discussed with: Anesthesiologist, CRNA and Surgeon  Anesthesia Plan Comments: (Patient consented for risks of anesthesia including but not limited to:  - adverse reactions to medications - damage to teeth, lips or other oral mucosa - sore throat or hoarseness - Damage to heart, brain, lungs or loss of life  Patient voiced understanding.)        Anesthesia Quick Evaluation

## 2019-04-20 NOTE — Anesthesia Postprocedure Evaluation (Signed)
Anesthesia Post Note  Patient: Austin Hunter  Procedure(s) Performed: SHOULDER ARTHROSCOPY WITH OPEN ROTATOR CUFF REPAIR (Left Shoulder)  Patient location during evaluation: PACU Anesthesia Type: General Level of consciousness: awake and alert Pain management: pain level controlled Vital Signs Assessment: post-procedure vital signs reviewed and stable Respiratory status: spontaneous breathing, nonlabored ventilation, respiratory function stable and patient connected to nasal cannula oxygen Cardiovascular status: blood pressure returned to baseline and stable Postop Assessment: no apparent nausea or vomiting Anesthetic complications: no     Last Vitals:  Vitals:   04/20/19 1806 04/20/19 1821  BP: 123/66 133/77  Pulse: (!) 59 64  Resp: (!) 23 18  Temp:  (!) 36.3 C  SpO2: 93% 95%    Last Pain:  Vitals:   04/20/19 1821  TempSrc:   PainSc: 0-No pain                 Mickle Campton S

## 2019-04-20 NOTE — Anesthesia Procedure Notes (Signed)
Procedure Name: Intubation Date/Time: 04/20/2019 2:39 PM Performed by: Chanetta Marshall, CRNA Pre-anesthesia Checklist: Patient identified, Emergency Drugs available, Suction available and Patient being monitored Patient Re-evaluated:Patient Re-evaluated prior to induction Oxygen Delivery Method: Circle system utilized Preoxygenation: Pre-oxygenation with 100% oxygen Induction Type: IV induction Ventilation: Mask ventilation without difficulty Laryngoscope Size: McGraph and 4 Grade View: Grade II Tube type: Oral Tube size: 7.5 mm Number of attempts: 1 Airway Equipment and Method: Stylet and Oral airway Placement Confirmation: ETT inserted through vocal cords under direct vision,  positive ETCO2,  breath sounds checked- equal and bilateral and CO2 detector Secured at: 22 cm Tube secured with: Tape Dental Injury: Teeth and Oropharynx as per pre-operative assessment

## 2019-04-20 NOTE — Anesthesia Post-op Follow-up Note (Signed)
Anesthesia QCDR form completed.        

## 2019-04-20 NOTE — Transfer of Care (Signed)
Immediate Anesthesia Transfer of Care Note  Patient: Austin Hunter  Procedure(s) Performed: SHOULDER ARTHROSCOPY WITH OPEN ROTATOR CUFF REPAIR (Left Shoulder)  Patient Location: PACU  Anesthesia Type:General  Level of Consciousness: drowsy  Airway & Oxygen Therapy: Patient Spontanous Breathing and Patient connected to face mask oxygen  Post-op Assessment: Report given to RN and Post -op Vital signs reviewed and stable  Post vital signs: Reviewed and stable  Last Vitals:  Vitals Value Taken Time  BP 112/52 04/20/19 1737  Temp 36.4 C 04/20/19 1736  Pulse 59 04/20/19 1740  Resp 25 04/20/19 1740  SpO2 99 % 04/20/19 1740  Vitals shown include unvalidated device data.  Last Pain:  Vitals:   04/20/19 1328  TempSrc:   PainSc: 0-No pain      Patients Stated Pain Goal: 2 (27/06/23 7628)  Complications: No apparent anesthesia complications

## 2019-04-21 ENCOUNTER — Encounter: Payer: Self-pay | Admitting: Orthopedic Surgery

## 2019-04-23 ENCOUNTER — Other Ambulatory Visit: Payer: Self-pay

## 2019-04-23 ENCOUNTER — Ambulatory Visit
Admission: RE | Admit: 2019-04-23 | Discharge: 2019-04-23 | Disposition: A | Discharge status: Home / Self Care | Payer: Federal, State, Local not specified - PPO | Source: Ambulatory Visit | Attending: Physician Assistant | Admitting: Physician Assistant

## 2019-04-23 ENCOUNTER — Other Ambulatory Visit (HOSPITAL_COMMUNITY): Payer: Self-pay | Admitting: Physician Assistant

## 2019-04-23 ENCOUNTER — Other Ambulatory Visit: Payer: Self-pay | Admitting: Physician Assistant

## 2019-04-23 ENCOUNTER — Other Ambulatory Visit (HOSPITAL_COMMUNITY): Payer: Self-pay

## 2019-04-23 ENCOUNTER — Emergency Department
Admission: EM | Admit: 2019-04-23 | Discharge: 2019-04-23 | Disposition: A | Discharge status: Home / Self Care | Payer: Federal, State, Local not specified - PPO | Attending: Emergency Medicine | Admitting: Emergency Medicine

## 2019-04-23 DIAGNOSIS — I10 Essential (primary) hypertension: Secondary | ICD-10-CM | POA: Diagnosis not present

## 2019-04-23 DIAGNOSIS — Z79899 Other long term (current) drug therapy: Secondary | ICD-10-CM | POA: Insufficient documentation

## 2019-04-23 DIAGNOSIS — Z7982 Long term (current) use of aspirin: Secondary | ICD-10-CM | POA: Diagnosis not present

## 2019-04-23 DIAGNOSIS — I82431 Acute embolism and thrombosis of right popliteal vein: Secondary | ICD-10-CM | POA: Insufficient documentation

## 2019-04-23 DIAGNOSIS — M79661 Pain in right lower leg: Secondary | ICD-10-CM | POA: Diagnosis present

## 2019-04-23 DIAGNOSIS — Z87891 Personal history of nicotine dependence: Secondary | ICD-10-CM | POA: Diagnosis not present

## 2019-04-23 DIAGNOSIS — R2241 Localized swelling, mass and lump, right lower limb: Secondary | ICD-10-CM

## 2019-04-23 LAB — COMPREHENSIVE METABOLIC PANEL
ALT: 26 U/L (ref 0–44)
AST: 23 U/L (ref 15–41)
Albumin: 3.8 g/dL (ref 3.5–5.0)
Alkaline Phosphatase: 30 U/L — ABNORMAL LOW (ref 38–126)
Anion gap: 10 (ref 5–15)
BUN: 21 mg/dL (ref 8–23)
CO2: 25 mmol/L (ref 22–32)
Calcium: 9.2 mg/dL (ref 8.9–10.3)
Chloride: 100 mmol/L (ref 98–111)
Creatinine, Ser: 1.23 mg/dL (ref 0.61–1.24)
GFR calc Af Amer: >60 mL/min (ref >60)
GFR calc non Af Amer: 57 mL/min — ABNORMAL LOW (ref >60)
Glucose, Bld: 236 mg/dL — ABNORMAL HIGH (ref 70–99)
Potassium: 4.4 mmol/L (ref 3.5–5.1)
Sodium: 135 mmol/L (ref 135–145)
Total Bilirubin: 0.9 mg/dL (ref 0.3–1.2)
Total Protein: 7.4 g/dL (ref 6.5–8.1)

## 2019-04-23 LAB — CBC
HCT: 41.3 % (ref 39.0–52.0)
Hemoglobin: 13.6 g/dL (ref 13.0–17.0)
MCH: 30.8 pg (ref 26.0–34.0)
MCHC: 32.9 g/dL (ref 30.0–36.0)
MCV: 93.4 fL (ref 80.0–100.0)
Platelets: 234 10*3/uL (ref 150–400)
RBC: 4.42 MIL/uL (ref 4.22–5.81)
RDW: 13.7 % (ref 11.5–15.5)
WBC: 11.2 10*3/uL — ABNORMAL HIGH (ref 4.0–10.5)
nRBC: 0 % (ref 0.0–0.2)

## 2019-04-23 MED ORDER — RIVAROXABAN 15 MG PO TABS
15.0000 mg | ORAL_TABLET | Freq: Once | ORAL | Status: AC
  Administered 2019-04-23: 15 mg via ORAL
  Filled 2019-04-23: qty 1

## 2019-04-23 MED ORDER — RIVAROXABAN (XARELTO) VTE STARTER PACK (15 & 20 MG): ORAL_TABLET | ORAL | 0 refills | Status: AC

## 2019-04-23 NOTE — ED Triage Notes (Signed)
Right lower leg cramping, had outpatient [place] and showed blood clot. Pt recent left rotator cuff surgery. Pt alert and oriented X4, cooperative, RR even and unlabored, color WNL. Pt in NAD.

## 2019-04-23 NOTE — ED Provider Notes (Signed)
[facility] Emergency Department Provider Note   ____________________________________________    I have reviewed the triage vital signs and the nursing notes.   HISTORY  Chief Complaint Other (Blood Clot)     HPI Austin Hunter is a 73 y.o. male who developed right-sided leg pain this morning which has been constant.  Patient had shoulder surgery 3 days ago with Dr. Lesleigh NoeKosinski.  He reports cramping pain in his right calf primarily which is moderate in intensity.  No radiation.  Is not take anything for this.  No shortness of breath or pleurisy  Past Medical History:  Diagnosis Date  . Enlarged prostate   . Hyperlipemia   . Hypertension   . Sleep apnea     There are no active problems to display for this patient.   Past Surgical History:  Procedure Laterality Date  . CHOLECYSTECTOMY    . COLONOSCOPY    . HERNIA REPAIR    . SHOULDER ARTHROSCOPY Left   . SHOULDER ARTHROSCOPY WITH OPEN ROTATOR CUFF REPAIR Left 04/20/2019   Procedure: SHOULDER ARTHROSCOPY WITH OPEN ROTATOR CUFF REPAIR;  Surgeon: Juanell FairlyKrasinski, Kevin, MD;  Location: [facility] ORS;  Service: Orthopedics;  Laterality: Left;    Prior to Admission medications   Medication Sig Start Date End Date Taking? Authorizing Provider  acetaminophen (TYLENOL) 500 MG tablet Take 500 mg by mouth every 6 (six) hours as needed for moderate pain or headache.    [provider]  aspirin EC 81 MG tablet Take 81 mg by mouth every 3 (three) days.    [provider]  fenofibrate (TRICOR) 145 MG tablet Take 145 mg by mouth at bedtime.    [provider]  lisinopril (ZESTRIL) 10 MG tablet Take 10 mg by mouth daily.    [provider]  Misc Natural Products (PROSTATE) CAPS Take 1 capsule by mouth 2 (two) times daily. Prosta-Strong    [provider]  Omega-3 Fatty Acids (OMEGA 3 PO) Take 1 capsule by mouth every other day.    [provider]  ondansetron (ZOFRAN)  4 MG tablet Take 1 tablet (4 mg total) by mouth every 8 (eight) hours as needed for nausea or vomiting. 04/20/19   Juanell FairlyKrasinski, Kevin, MD  OVER THE COUNTER MEDICATION Take 7 capsules by mouth daily. Melaleuca peek performance heart health vitamin package    [provider]  OVER THE COUNTER MEDICATION Take 1 tablet by mouth at bedtime. Immuno shield otc supplement    [provider]  oxyCODONE (OXY IR/ROXICODONE) 5 MG immediate release tablet Take 1 tablet (5 mg total) by mouth every 4 (four) hours as needed. 04/20/19   Juanell FairlyKrasinski, Kevin, MD  Rivaroxaban 15 & 20 MG TBPK Take as directed on package: Start with one 15mg  tablet by mouth twice a day with food. On Day 22, switch to one 20mg  tablet once a day with food. 04/23/19   Jene EveryKinner, Eleasha Cataldo, MD  tamsulosin (FLOMAX) 0.4 MG CAPS capsule Take 0.4 mg by mouth at bedtime.    [provider]  Vitamin Mixture (ESTER-C PO) Take 1 tablet by mouth at bedtime.    [provider]     Allergies Ciprofloxacin and Codeine  No family history on file.  Social History Social History   Tobacco Use  . Smoking status: Former Smoker    Quit date: 1985    Years since quitting: 35.6  . Smokeless tobacco: Never Used  Substance Use Topics  . Alcohol use: Not Currently  Frequency: Never  . Drug use: Never    Review of Systems  Constitutional: No fever/chills  Cardiovascular: Denies chest pain.  No pleurisy Respiratory: Denies shortness of breath. Gastrointestinal: No abdominal pain.    Genitourinary: Negative for dysuria. Musculoskeletal: As above Skin: Negative for rash. Neurological: Negative for headaches   ____________________________________________   PHYSICAL EXAM:  VITAL SIGNS: ED Triage Vitals  Enc Vitals Group     BP 04/23/19 1710 (!) 145/66     Pulse Rate 04/23/19 1710 68     Resp 04/23/19 1710 18     Temp 04/23/19 1710 97.8 F (36.6 C)     Temp Source 04/23/19 1710 Oral     SpO2 04/23/19 1710 94 %      Weight 04/23/19 1711 91 kg (200 lb 9.9 oz)     Height 04/23/19 1711 1.778 m (5\' 10" )     Head Circumference --      Peak Flow --      Pain Score 04/23/19 1710 6     Pain Loc --      Pain Edu? --      Excl. in [place]? --     Constitutional: Alert and oriented.  .   Neck:  Painless ROM Cardiovascular: Normal rate, regular rhythm. Grossly normal heart sounds.  Good peripheral circulation. Respiratory: Normal respiratory effort.  No retractions. Lungs CTAB. Gastrointestinal: Soft and nontender. No distention.    Musculoskeletal: Patient with mild swelling to the right calf, no erythema, warm and well-perfused Neurologic:  Normal speech and language. No gross focal neurologic deficits are appreciated.  Skin:  Skin is warm, dry and intact. No rash noted. Psychiatric: Mood and affect are normal. Speech and behavior are normal.  ____________________________________________   LABS (all labs ordered are listed, but only abnormal results are displayed)  Labs Reviewed  CBC - Abnormal; Notable for the following components:      Result Value   WBC 11.2 (*)    All other components within normal limits  COMPREHENSIVE METABOLIC PANEL - Abnormal; Notable for the following components:   Glucose, Bld 236 (*)    Alkaline Phosphatase 30 (*)    GFR calc non Af Amer 57 (*)    All other components within normal limits   ____________________________________________  EKG  None ____________________________________________  RADIOLOGY  Ultrasound reviewed, popliteal DVT, no femoral DVT ____________________________________________   PROCEDURES  Procedure(s) performed: No  Procedures   Critical Care performed: No ____________________________________________   INITIAL IMPRESSION / ASSESSMENT AND PLAN / ED COURSE  Pertinent labs & imaging results that were available during my care of the patient were reviewed by me and considered in my medical decision making (see chart for details).   Patient with DVT as described in ultrasound report.  Lab work is reassuring.  Discussed with Dr. Harlow Mares orthopedic surgery who reports it is appropriate to start blood thinners given 3 days out from surgery.  We will start him on Xarelto.  Outpatient follow-up with PCP.  Return precautions discussed.  Blood thinner precautions discussed    ____________________________________________   FINAL CLINICAL IMPRESSION(S) / ED DIAGNOSES  Final diagnoses:  Acute deep vein thrombosis (DVT) of popliteal vein of right lower extremity ([place])        Note:  This document was prepared using Dragon voice recognition software and may include unintentional dictation errors.   Lavonia Drafts, MD 04/23/19 2340

## 2019-04-23 NOTE — ED Notes (Signed)
Assumed care of patient reports pain to left calf. Pat s/p rotator cuff surgery this week, reports pain since surgery today worse.

## 2019-04-30 NOTE — Op Note (Signed)
04/20/2019  6:39 PM  PATIENT:  Austin Hunter  73 y.o. male  PRE-OPERATIVE DIAGNOSIS:  M75.112 incomplete rotator cuff tear or rupture of left shoulder  POST-OPERATIVE DIAGNOSIS:  Incomplete rotator cuff tear or rupture of left shoulder, subacromial impingement, acromioclavicular arthrosis  PROCEDURE:  Procedure(s): LEFT SHOULDER ARTHROSCOPIC SUBACROMIAL DECOMPRESSION, DISTAL CLAVICLE EXCISION, BICEPS TENOTOMY WITH OPEN ROTATOR CUFF REPAIR (Left)  SURGEON:  Surgeon(s) and Role:    Thornton Park, MD - Primary  ANESTHESIA:   general and paracervical block   PREOPERATIVE INDICATIONS:  Austin Hunter is a  73 y.o. male with a diagnosis of M75.112 incomplete rotator cuff tear or rupture of left shoulder failed conservative treatment and elected for surgical management.    The risks benefits and alternatives were discussed with the patient preoperatively including but not limited to the risks of infection, bleeding, nerve injury, persistent pain or weakness, failure of the hardware, re-tear of the rotator cuff and the need for further surgery. Medical risks include DVT and pulmonary embolism, myocardial infarction, stroke, pneumonia, respiratory failure and death. Patient understood these risks and wished to proceed.  OPERATIVE IMPLANTS: [place] Multifix anchors x 3 & Smith and Nephew Q Fix anchors x 2  OPERATIVE FINDINGS: High grade partial tear of the biceps tendon, partial thickness tear of the supraspinatus, subacromial impingement and bursitis and acromioclavicular joint arthrosis  OPERATIVE PROCEDURE: The patient was met in the preoperative area. The left shoulder was signed with the word yes and my initials according the hospital's correct site of surgery protocol.   A pre-op history and physical was performed at the bedside.   Patient underwent an interscalene block with Exparel by the anesthesia service.  Patient was brought to the operating room where he underwent general  anesthesia.  The patient was placed in a beachchair position.  A spider arm positioner was used for this case. Examination under anesthesia revealed Hunter limitation of motion or instability with load shift testing. The patient had a negative sulcus sign.  Patient was prepped and draped in a sterile fashion. A timeout was performed to verify the patient's name, date of birth, medical record number, correct site of surgery and correct procedure to be performed there was also used to verify the patient received antibiotics that all appropriate instruments, implants and radiographs studies were available in the room. Once all in attendance were in agreement case began.  Bony landmarks were drawn out with a surgical marker along with proposed arthroscopy incisions. These were pre-injected with 1% lidocaine plain. An 11 blade was used to establish a posterior portal through which the arthroscope was placed in the glenohumeral joint. A full diagnostic examination of the shoulder was performed.  Patient was found to have a high grade partial tear of the biceps tendon and a tenotomy was performed with an Arthrocare wand. An 18-gauge spinal needle was used to place a 0 PDS suture through the rotator cuff tear for identification from the bursal side.  The arthroscope was then placed in the subacromial space.  Extensive bursitis was encountered and debrided using a 4-0 resector shaver blade and a 90 ArthroCare wand from a lateral portal which was established under direct visualization using an 18-gauge spinal needle. A subacromial decompression was also performed using a 5.5 mm resector shaver blade from the lateral portal. The 0 PDS suture was identified.  The partial-thickness tear was then completed with a 4.0 mm resector shaver blade.  Four Perfect Pass suture were placed in the  lateral border of the rotator cuff tear. All arthroscopic instruments were then removed and the mini-open portion of the procedure  began.   A saber-type incision was made along the lateral border of the acromion. The deltoid muscle was identified and split in line with its fibers which allowed visualization of the rotator cuff. The Perfect Pass sutures previously placed in the lateral border of the rotator cuff were brought out through the deltoid split.  Two additional Perfect Pass sutures were placed in the lateral border of the rotator cuff.  A 5.5 mm resector shaver blade was then used to debride the greater tuberosity of all torn fibers of the rotator cuff.  Two Smith and Nephew Q Fix anchors were placed at the articular margin of the humeral head with the greater tuberosity. The suture limbs of the Q Fix anchor were passed medially through the rotator cuff using a First Pass suture passer.   The 6 Perfect Pass sutures were then anchored to the greater tuberosity footprint using three Smith& Nephew Multifix anchors. These anchors were tensioned to allow for anatomic reduction of the rotator cuff to the greater tuberosity. The medial row sutures were then tied down using an arthroscopic knot tying technique.  Arthroscopic images of the repair were taken with the arthroscope both externally and from inside the glenohumeral joint.  All incisions were copiously irrigated. The deltoid fascia was repaired using a 0 Vicryl suture.  The subcutaneous tissue of all incisions were closed with a 2-0 Vicryl. Skin closure for the arthroscopic incisions was performed with 4-0 nylon. The skin edges of the saber incision was approximated with a running 4-0 undyed Monocryl.  A dry sterile dressing was applied.  The patient was placed in an abduction sling and a Polar Care was applied to the shoulder.  All sharp and it instrument counts were correct at the conclusion of the case. I was scrubbed and present for the entire case. I spoke with the patient's wife postoperatively to let her know the case had been performed without complication and the  patient was stable in recovery room. 

## 2019-05-07 ENCOUNTER — Ambulatory Visit (INDEPENDENT_AMBULATORY_CARE_PROVIDER_SITE_OTHER): Payer: Federal, State, Local not specified - PPO | Admitting: Vascular Surgery

## 2019-05-07 ENCOUNTER — Other Ambulatory Visit (INDEPENDENT_AMBULATORY_CARE_PROVIDER_SITE_OTHER): Payer: Self-pay | Admitting: Vascular Surgery

## 2019-05-07 ENCOUNTER — Other Ambulatory Visit: Payer: Self-pay

## 2019-05-07 ENCOUNTER — Ambulatory Visit (INDEPENDENT_AMBULATORY_CARE_PROVIDER_SITE_OTHER): Payer: Federal, State, Local not specified - PPO

## 2019-05-07 ENCOUNTER — Encounter (INDEPENDENT_AMBULATORY_CARE_PROVIDER_SITE_OTHER): Payer: Self-pay | Admitting: Vascular Surgery

## 2019-05-07 VITALS — BP 136/85 | HR 60 | Resp 16 | Wt 201.0 lb

## 2019-05-07 DIAGNOSIS — I10 Essential (primary) hypertension: Secondary | ICD-10-CM

## 2019-05-07 DIAGNOSIS — I82431 Acute embolism and thrombosis of right popliteal vein: Secondary | ICD-10-CM | POA: Diagnosis not present

## 2019-05-07 DIAGNOSIS — M7542 Impingement syndrome of left shoulder: Secondary | ICD-10-CM | POA: Diagnosis not present

## 2019-05-07 DIAGNOSIS — I82409 Acute embolism and thrombosis of unspecified deep veins of unspecified lower extremity: Secondary | ICD-10-CM | POA: Insufficient documentation

## 2019-05-07 NOTE — Progress Notes (Signed)
Patient ID: Austin Hunter Teeple, male   DOB: Jul 14, 1946, 73 y.o.   MRN: [id number]  Chief Complaint  Patient presents with   New Patient (Initial Visit)    ref Martha ClanKrasinski for le dvt    HPI Austin Hunter Cogar is a 73 y.o. male.  I am asked to see the patient by Dr. Martha ClanKrasinski for evaluation of DVT.  A couple of weeks ago, the patient underwent left shoulder surgery.  A few days later, he had pain and swelling in his right leg and was found to have a DVT in the peroneal and popliteal vein on a hospital ultrasound.  He was started on Xarelto within a few days his leg pain and swelling resolved.  He did have an allergic reaction to the Xarelto and developed severe itching and rash throughout his body.  He was even started on a steroid Dosepak which helped the allergy somewhat, but it did not resolve.  He was switched appropriately to Eliquis last week and the rash is getting worse and is actually as bad as it has been again.  He is referred today and we repeated an ultrasound which demonstrates resolution of the right lower extremity DVT with no thrombus seen today.  He has had no bleeding issues.  He has no fever or chills.     Past Medical History:  Diagnosis Date   Enlarged prostate    Hyperlipemia    Hypertension    Sleep apnea     Past Surgical History:  Procedure Laterality Date   CHOLECYSTECTOMY     COLONOSCOPY     HERNIA REPAIR     SHOULDER ARTHROSCOPY Left    SHOULDER ARTHROSCOPY WITH OPEN ROTATOR CUFF REPAIR Left 04/20/2019   Procedure: SHOULDER ARTHROSCOPY WITH OPEN ROTATOR CUFF REPAIR;  Surgeon: Juanell FairlyKrasinski, Kevin, MD;  Location: [facility];  Service: Orthopedics;  Laterality: Left;    Family History Family History  Problem Relation Age of Onset   Heart disease Mother    Cancer Mother    COPD Mother    Heart disease Father   no bleeding or clotting disorders  Social History Social History   Tobacco Use   Smoking status: Former Smoker    Quit date: 1985   Years since quitting: 35.7   Smokeless tobacco: Never Used  Substance Use Topics   Alcohol use: Not Currently    Frequency: Never   Drug use: Never    Allergies  Allergen Reactions   Ciprofloxacin Hives   Codeine Nausea Only    Current Outpatient Medications  Medication Sig Dispense Refill   acetaminophen (TYLENOL) 500 MG tablet Take 500 mg by mouth every 6 (six) hours as needed for moderate pain or headache.     aspirin EC 81 MG tablet Take 81 mg by mouth every 3 (three) days.     ELIQUIS 5 MG TABS tablet      fenofibrate (TRICOR) 145 MG tablet Take 145 mg by mouth at bedtime.     lisinopril (ZESTRIL) 10 MG tablet Take 10 mg by mouth daily.     Misc Natural Products (PROSTATE) CAPS Take 1 capsule by mouth 2 (two) times daily. Prosta-Strong     Omega-3 Fatty Acids (OMEGA 3 PO) Take 1 capsule by mouth every other day.     ondansetron (ZOFRAN) 4 MG tablet Take 1 tablet (4 mg total) by mouth every 8 (eight) hours as needed for nausea or vomiting. 30 tablet 0   OVER THE COUNTER MEDICATION Take 7  capsules by mouth daily. Melaleuca peek performance heart health vitamin package     OVER THE COUNTER MEDICATION Take 1 tablet by mouth at bedtime. Immuno shield otc supplement     tamsulosin (FLOMAX) 0.4 MG CAPS capsule Take 0.4 mg by mouth at bedtime.     Vitamin Mixture (ESTER-C PO) Take 1 tablet by mouth at bedtime.     oxyCODONE (OXY IR/ROXICODONE) 5 MG immediate release tablet Take 1 tablet (5 mg total) by mouth every 4 (four) hours as needed. (Patient not taking: Reported on 05/07/2019) 40 tablet 0   Rivaroxaban 15 & 20 MG TBPK Take as directed on package: Start with one 15mg  tablet by mouth twice a day with food. On Day 22, switch to one 20mg  tablet once a day with food. (Patient not taking: Reported on 05/07/2019) 51 each 0   No current facility-administered medications for this visit.       REVIEW OF SYSTEMS (Negative unless checked)  Constitutional: [] Weight loss   [] Fever  [] Chills Cardiac: [] Chest pain   [] Chest pressure   [] Palpitations   [] Shortness of breath when laying flat   [] Shortness of breath at rest   [] Shortness of breath with exertion. Vascular:  [] Pain in legs with walking   [] Pain in legs at rest   [] Pain in legs when laying flat   [] Claudication   [] Pain in feet when walking  [] Pain in feet at rest  [] Pain in feet when laying flat   [] History of DVT   [x] Phlebitis   [x] Swelling in legs   [] Varicose veins   [] Non-healing ulcers Pulmonary:   [] Uses home oxygen   [] Productive cough   [] Hemoptysis   [] Wheeze  [] COPD   [] Asthma Neurologic:  [] Dizziness  [] Blackouts   [] Seizures   [] History of stroke   [] History of TIA  [] Aphasia   [] Temporary blindness   [] Dysphagia   [] Weakness or numbness in arms   [] Weakness or numbness in legs Musculoskeletal:  [x] Arthritis   [] Joint swelling   [] Joint pain   [] Low back pain Hematologic:  [] Easy bruising  [] Easy bleeding   [] Hypercoagulable state   [] Anemic  [] Hepatitis Gastrointestinal:  [] Blood in stool   [] Vomiting blood  [] Gastroesophageal reflux/heartburn   [] Abdominal pain Genitourinary:  [] Chronic kidney disease   [] Difficult urination  [] Frequent urination  [] Burning with urination   [] Hematuria Skin:  [] Rashes   [] Ulcers   [] Wounds Psychological:  [] History of anxiety   []  History of major depression.    Physical Exam BP 136/85 (BP Location: Right Arm)    Pulse 60    Resp 16    Wt 201 lb (91.2 kg)    BMI 28.84 kg/m  Gen:  WD/WN, NAD Head: [place]/AT, No temporalis wasting.  Ear/Nose/Throat: Hearing grossly intact, nares w/o erythema or drainage, oropharynx w/o Erythema/Exudate Eyes: Conjunctiva clear, sclera non-icteric  Neck: trachea midline.  No JVD.  Pulmonary:  Good air movement, respirations not labored, no use of accessory muscles  Cardiac: RRR, no JVD Vascular:  Vessel Right Left  Radial Palpable Palpable                                   Gastrointestinal:. No masses, surgical  incisions, or scars. Musculoskeletal: M/S 5/5 throughout.  Extremities without ischemic changes.  No deformity or atrophy.  No lower extremity edema. Neurologic: Sensation grossly intact in extremities.  Symmetrical.  Speech is fluent. Motor exam as listed above. Psychiatric: Judgment  intact, Mood & affect appropriate for pt's clinical situation. Dermatologic: Raised red rash particularly prominent in the trunk but also seen on the arms and legs.    Radiology US Venous Img Lower Unilateral Right  Result Date: 04/23/2019 CLINICAL DATA:  73 year old male with right lower extremity swelling EXAM: RIGHT LOWER EXTREMITY VENOUS DOPPLER ULTRASOUND TECHNIQUE: Gray-scale sonography with graded compression, as well as color Doppler and duplex ultrasound were performed to evaluate the lower extremity deep venous systems from the level of the common femoral vein and including the common femoral, femoral, profunda femoral, popliteal and calf veins including the posterior tibial, peroneal and gastrocnemius veins when visible. The superficial great saphenous vein was also interrogated. Spectral Doppler was utilized to evaluate flow at rest and with distal augmentation maneuvers in the common femoral, femoral and popliteal veins. COMPARISON:  None. FINDINGS: Contralateral Common Femoral Vein: Respiratory phasicity is normal and symmetric with the symptomatic side. No evidence of thrombus. Normal compressibility. Common Femoral Vein: No evidence of thrombus. Normal compressibility, respiratory phasicity and response to augmentation. Saphenofemoral Junction: No evidence of thrombus. Normal compressibility and flow on color Doppler imaging. Profunda Femoral Vein: No evidence of thrombus. Normal compressibility and flow on color Doppler imaging. Femoral Vein: No evidence of thrombus. Normal compressibility, respiratory phasicity and response to augmentation. Popliteal Vein: Not compressible. The lumen is expanded and filled  with low-level internal echoes. Minimal flow visualized on color Doppler imaging. Calf Veins: Occlusive thrombus throughout the posterior tibial and peroneal veins. Superficial Great Saphenous Vein: No evidence of thrombus. Normal compressibility. Venous Reflux:  None. Other Findings:  None. IMPRESSION: Positive for acute occlusive DVT in the popliteal and calf veins. These results will be called to the ordering clinician or representative by the Radiologist Assistant, and communication documented in the PACS or zVision Dashboard. Electronically Signed   By: Jacqulynn Cadet M.D.   On: 04/23/2019 16:48   [place] Or Nerve Block-image Only ([facility])  Result Date: 04/20/2019 There is no interpretation for this exam.  This order is for images obtained during a surgical procedure.  Please See "Surgeries" Tab for more information regarding the procedure.    Labs Recent Results (from the past 2160 hour(s))  SARS CORONAVIRUS 2 (TAT 6-12 HRS) Nasal Swab Aptima Multi Swab     Status: None   Collection Time: 04/16/19  1:45 PM   Specimen: Aptima Multi Swab; Nasal Swab  Result Value Ref Range   SARS Coronavirus 2 NEGATIVE NEGATIVE    Comment: (NOTE) SARS-CoV-2 target nucleic acids are NOT DETECTED. The SARS-CoV-2 RNA is generally detectable in upper and lower respiratory specimens during the acute phase of infection. Negative results do not preclude SARS-CoV-2 infection, do not rule out co-infections with other pathogens, and should not be used as the sole basis for treatment or other patient management decisions. Negative results must be combined with clinical observations, patient history, and epidemiological information. The expected result is Negative. Fact Sheet for Patients: SugarRoll.be Fact Sheet for Healthcare Providers: https://www.woods-mathews.com/ This test is not yet approved or cleared by the [place] FDA and  has been authorized for detection  and/or diagnosis of SARS-CoV-2 by FDA under an Emergency Use Authorization (EUA). This EUA will remain  in effect (meaning this test can be used) for the duration of the COVID-19 declaration under Section 56 4(b)(1) of the Act, 21 U.S.C. section 360bbb-3(b)(1), unless the authorization is terminated or revoked sooner. Performed at [facility], [place] [address] [place], [place] [address]   APTT  Status: None   Collection Time: 04/19/19  2:17 PM  Result Value Ref Range   aPTT 27 24 - 36 seconds    Comment: Performed at [facility], [address] [place], [place] [address]  Basic metabolic panel     Status: Abnormal   Collection Time: 04/19/19  2:17 PM  Result Value Ref Range   Sodium 137 135 - 145 mmol/L   Potassium 4.0 3.5 - 5.1 mmol/L   Chloride 103 98 - 111 mmol/L   CO2 24 22 - 32 mmol/L   Glucose, Bld 131 (H) 70 - 99 mg/dL   BUN 22 8 - 23 mg/dL   Creatinine, Ser 2.69 0.61 - 1.24 mg/dL   Calcium 9.7 8.9 - 48.5 mg/dL   GFR calc non Af Amer >60 >60 mL/min   GFR calc Af Amer >60 >60 mL/min   Anion gap 10 5 - 15    Comment: Performed at [facility], [address] [place], [place] [address]  CBC WITH DIFFERENTIAL     Status: None   Collection Time: 04/19/19  2:17 PM  Result Value Ref Range   WBC 7.2 4.0 - 10.5 K/uL   RBC 4.41 4.22 - 5.81 MIL/uL   Hemoglobin 13.7 13.0 - 17.0 g/dL   HCT 35.0 09.3 - 81.8 %   MCV 94.6 80.0 - 100.0 fL   MCH 31.1 26.0 - 34.0 pg   MCHC 32.9 30.0 - 36.0 g/dL   RDW 29.9 37.1 - 69.6 %   Platelets 268 150 - 400 K/uL   nRBC 0.0 0.0 - 0.2 %   Neutrophils Relative % 58 %   Neutro Abs 4.2 1.7 - 7.7 K/uL   Lymphocytes Relative 27 %   Lymphs Abs 1.9 0.7 - 4.0 K/uL   Monocytes Relative 11 %   Monocytes Absolute 0.8 0.1 - 1.0 K/uL   Eosinophils Relative 2 %   Eosinophils Absolute 0.2 0.0 - 0.5 K/uL   Basophils Relative 1 %   Basophils Absolute 0.1 0.0 - 0.1 K/uL   Immature Granulocytes 1 %   Abs Immature Granulocytes  0.05 0.00 - 0.07 K/uL    Comment: Performed at [facility], [address] [place], [place] [address]  Protime-INR     Status: None   Collection Time: 04/19/19  2:17 PM  Result Value Ref Range   Prothrombin Time 14.2 11.4 - 15.2 seconds   INR 1.1 0.8 - 1.2    Comment: (NOTE) INR goal varies based on device and disease states. Performed at [facility], [address] [place], [place] [address]   CBC     Status: Abnormal   Collection Time: 04/23/19  5:12 PM  Result Value Ref Range   WBC 11.2 (H) 4.0 - 10.5 K/uL   RBC 4.42 4.22 - 5.81 MIL/uL   Hemoglobin 13.6 13.0 - 17.0 g/dL   HCT 10.2 58.5 - 27.7 %   MCV 93.4 80.0 - 100.0 fL   MCH 30.8 26.0 - 34.0 pg   MCHC 32.9 30.0 - 36.0 g/dL   RDW 82.4 23.5 - 36.1 %   Platelets 234 150 - 400 K/uL   nRBC 0.0 0.0 - 0.2 %    Comment: Performed at [facility], [address], [place], [place] [address]  Comprehensive metabolic panel     Status: Abnormal   Collection Time: 04/23/19  5:12 PM  Result Value Ref Range   Sodium 135 135 - 145 mmol/L   Potassium  4.4 3.5 - 5.1 mmol/L   Chloride 100 98 - 111 mmol/L   CO2 25 22 - 32 mmol/L   Glucose, Bld 236 (H) 70 - 99 mg/dL   BUN 21 8 - 23 mg/dL   Creatinine, Ser 4.03 0.61 - 1.24 mg/dL   Calcium 9.2 8.9 - 47.4 mg/dL   Total Protein 7.4 6.5 - 8.1 g/dL   Albumin 3.8 3.5 - 5.0 g/dL   AST 23 15 - 41 U/L   ALT 26 0 - 44 U/L   Alkaline Phosphatase 30 (L) 38 - 126 U/L   Total Bilirubin 0.9 0.3 - 1.2 mg/dL   GFR calc non Af Amer 57 (L) >60 mL/min   GFR calc Af Amer >60 >60 mL/min   Anion gap 10 5 - 15    Comment: Performed at [facility], [address], [place], [place] [address]    Assessment/Plan:  Impingement syndrome of shoulder region Status post surgery and other than the DVT, seems to be doing well.  Essential hypertension blood pressure control important in reducing the progression of atherosclerotic disease. On appropriate oral  medications.   DVT (deep venous thrombosis) (HCC) we repeated an ultrasound which demonstrates resolution of the right lower extremity DVT with no thrombus seen today. We had a long discussion today about options for treatment.  Normally, we would do a 53-month course of anticoagulation for a popliteal vein DVT.  He has had several weeks, but is having severe allergic reactions to the 2 main medications we use for this.  He is also had complete resolution of his DVT on ultrasound.  He has no symptoms.  We have discussed 3 options today.   The first option would be continuing on Eliquis and trying to manage the allergic symptoms.  His allergic symptoms are getting worse, and I do not think that is a great option. The second option would be switching over to either Pradaxa (which may have the same side effects as Eliquis and Xarelto) or Coumadin.  Switching to Coumadin would require a transition bridge, monitoring of his INR, and close monitoring of his medications for side effects as well as changing his diet. The third option would be coming off of full anticoagulation and using Plavix.  He had a very low risk DVT initially, and he has had complete resolution with a short course of anticoagulation.  Given his difficulties with severe allergic reactions this may be the most reasonable option.  I would repeat an ultrasound in 2 to 3 weeks to ensure that there has not been new thrombosis or rethrombosis.  This will be our plan after discussion with he and his wife they feel that this is the best option.  I think this is a pretty reasonable option as well particularly given the fact that he has had resolution on duplex      Festus Barren 05/07/2019, 12:28 PM   This note was created with Dragon medical transcription system.  Any errors from dictation are unintentional.

## 2019-05-07 NOTE — Assessment & Plan Note (Signed)
blood pressure control important in reducing the progression of atherosclerotic disease. On appropriate oral medications.  

## 2019-05-07 NOTE — Patient Instructions (Signed)

## 2019-05-07 NOTE — Assessment & Plan Note (Signed)
Status post surgery and other than the DVT, seems to be doing well.

## 2019-05-07 NOTE — Assessment & Plan Note (Signed)
we repeated an ultrasound which demonstrates resolution of the right lower extremity DVT with no thrombus seen today. We had a long discussion today about options for treatment.  Normally, we would do a 34-month course of anticoagulation for a popliteal vein DVT.  He has had several weeks, but is having severe allergic reactions to the 2 main medications we use for this.  He is also had complete resolution of his DVT on ultrasound.  He has no symptoms.  We have discussed 3 options today.   The first option would be continuing on Eliquis and trying to manage the allergic symptoms.  His allergic symptoms are getting worse, and I do not think that is a great option. The second option would be switching over to either Pradaxa (which may have the same side effects as Eliquis and Xarelto) or Coumadin.  Switching to Coumadin would require a transition bridge, monitoring of his INR, and close monitoring of his medications for side effects as well as changing his diet. The third option would be coming off of full anticoagulation and using Plavix.  He had a very low risk DVT initially, and he has had complete resolution with a short course of anticoagulation.  Given his difficulties with severe allergic reactions this may be the most reasonable option.  I would repeat an ultrasound in 2 to 3 weeks to ensure that there has not been new thrombosis or rethrombosis.  This will be our plan after discussion with he and his wife they feel that this is the best option.  I think this is a pretty reasonable option as well particularly given the fact that he has had resolution on duplex

## 2019-05-19 ENCOUNTER — Encounter (INDEPENDENT_AMBULATORY_CARE_PROVIDER_SITE_OTHER): Payer: Self-pay | Admitting: Nurse Practitioner

## 2019-05-19 ENCOUNTER — Other Ambulatory Visit: Payer: Self-pay

## 2019-05-19 ENCOUNTER — Ambulatory Visit (INDEPENDENT_AMBULATORY_CARE_PROVIDER_SITE_OTHER): Payer: Federal, State, Local not specified - PPO

## 2019-05-19 ENCOUNTER — Ambulatory Visit (INDEPENDENT_AMBULATORY_CARE_PROVIDER_SITE_OTHER): Payer: Federal, State, Local not specified - PPO | Admitting: Nurse Practitioner

## 2019-05-19 VITALS — BP 163/77 | HR 58 | Resp 16 | Wt 207.0 lb

## 2019-05-19 DIAGNOSIS — M7542 Impingement syndrome of left shoulder: Secondary | ICD-10-CM | POA: Diagnosis not present

## 2019-05-19 DIAGNOSIS — I82431 Acute embolism and thrombosis of right popliteal vein: Secondary | ICD-10-CM

## 2019-05-19 DIAGNOSIS — I10 Essential (primary) hypertension: Secondary | ICD-10-CM

## 2019-05-20 ENCOUNTER — Encounter (INDEPENDENT_AMBULATORY_CARE_PROVIDER_SITE_OTHER): Payer: Self-pay | Admitting: Nurse Practitioner

## 2019-05-20 NOTE — Progress Notes (Signed)
SUBJECTIVE:  Patient ID: Austin Hunter, male    DOB: Sep 03, 1945, 73 y.o.   MRN: [id number] Chief Complaint  Patient presents with  . Follow-up    ultrasound follow up    HPI  Austin RadKenneth F Hunter is a 73 y.o. male presents today for follow-up ultrasound for right lower extremity DVT.  The patient recently underwent shoulder surgery and subsequently found that he had a DVT within the popliteal, posterior tibial and peroneal veins.  The patient was promptly started on Xarelto but found to have an allergic reaction.  The patient was subsequently switched to Eliquis however it was also noted that his allergic reaction actually became worse with this.  Subsequently the patient was switched to Plavix.  Today, the patient states that the rash is still present however after a Dosepak, as well as daily Claritin and hydrocortisone cream it is feeling much better.  The patient does endorse that itch is much less at this time.  It is more tolerable.  He states that it only becomes red and noticeable when he is out of the shower or doing some excessive sweating.  Otherwise the patient denies any fever, chills, nausea, vomiting or diarrhea.  He denies any chest pain or shortness of breath.  He denies any TIA-like symptoms.  Patient also states that the swelling of his right lower extremity has not been severe and well-controlled.  Today noninvasive studies show that there is no evidence of DVT in the right lower extremity as well as no evidence of superficial venous thrombosis.  Past Medical History:  Diagnosis Date  . Enlarged prostate   . Hyperlipemia   . Hypertension   . Sleep apnea     Past Surgical History:  Procedure Laterality Date  . CHOLECYSTECTOMY    . COLONOSCOPY    . HERNIA REPAIR    . SHOULDER ARTHROSCOPY Left   . SHOULDER ARTHROSCOPY WITH OPEN ROTATOR CUFF REPAIR Left 04/20/2019   Procedure: SHOULDER ARTHROSCOPY WITH OPEN ROTATOR CUFF REPAIR;  Surgeon: Juanell FairlyKrasinski, Kevin, MD;  Location:  [facility] ORS;  Service: Orthopedics;  Laterality: Left;    Social History   Socioeconomic History  . Marital status: Married    Spouse name: Not on file  . Number of children: Not on file  . Years of education: Not on file  . Highest education level: Not on file  Occupational History  . Not on file  Social Needs  . Financial resource strain: Not on file  . Food insecurity    Worry: Not on file    Inability: Not on file  . Transportation needs    Medical: Not on file    Non-medical: Not on file  Tobacco Use  . Smoking status: Former Smoker    Quit date: 1985    Years since quitting: 35.7  . Smokeless tobacco: Never Used  Substance and Sexual Activity  . Alcohol use: Not Currently    Frequency: Never  . Drug use: Never  . Sexual activity: Not on file  Lifestyle  . Physical activity    Days per week: Not on file    Minutes per session: Not on file  . Stress: Not on file  Relationships  . Social Musicianconnections    Talks on phone: Not on file    Gets together: Not on file    Attends religious service: Not on file    Active member of club or organization: Not on file    Attends meetings of clubs or organizations:  Not on file    Relationship status: Not on file  . Intimate partner violence    Fear of current or ex partner: Not on file    Emotionally abused: Not on file    Physically abused: Not on file    Forced sexual activity: Not on file  Other Topics Concern  . Not on file  Social History Narrative  . Not on file    Family History  Problem Relation Age of Onset  . Heart disease Mother   . Cancer Mother   . COPD Mother   . Heart disease Father     Allergies  Allergen Reactions  . Ciprofloxacin Hives  . Codeine Nausea Only     Review of Systems   Review of Systems: Negative Unless Checked Constitutional: [] Weight loss  [] Fever  [] Chills Cardiac: [] Chest pain   []  Atrial Fibrillation  [] Palpitations   [] Shortness of breath when laying flat   [] Shortness of  breath with exertion. [] Shortness of breath at rest Vascular:  [] Pain in legs with walking   [] Pain in legs with standing [] Pain in legs when laying flat   [] Claudication    [] Pain in feet when laying flat    [] History of DVT   [] Phlebitis   [] Swelling in legs   [] Varicose veins   [] Non-healing ulcers Pulmonary:   [] Uses home oxygen   [] Productive cough   [] Hemoptysis   [] Wheeze  [] COPD   [] Asthma Neurologic:  [] Dizziness   [] Seizures  [] Blackouts [] History of stroke   [] History of TIA  [] Aphasia   [] Temporary Blindness   [x] Weakness or numbness in arm   [] Weakness or numbness in leg Musculoskeletal:   [] Joint swelling   [] Joint pain   [] Low back pain  []  History of Knee Replacement [] Arthritis [] back Surgeries  []  Spinal Stenosis    Hematologic:  [] Easy bruising  [x] Easy bleeding   [] Hypercoagulable state   [] Anemic Gastrointestinal:  [] Diarrhea   [] Vomiting  [] Gastroesophageal reflux/heartburn   [] Difficulty swallowing. [] Abdominal pain Genitourinary:  [] Chronic kidney disease   [] Difficult urination  [] Anuric   [] Blood in urine [] Frequent urination  [] Burning with urination   [] Hematuria Skin:  [] Rashes   [] Ulcers [] Wounds Psychological:  [] History of anxiety   []  History of major depression  []  Memory Difficulties      OBJECTIVE:   Physical Exam  BP (!) 163/77 (BP Location: Right Arm)   Pulse (!) 58   Resp 16   Wt 207 lb (93.9 kg)   BMI 29.70 kg/m   Gen: WD/WN, NAD Head: [place]/AT, No temporalis wasting.  Ear/Nose/Throat: Hearing grossly intact, nares w/o erythema or drainage Eyes: PER, EOMI, sclera nonicteric.  Neck: Supple, no masses.  No JVD.  Pulmonary:  Good air movement, no use of accessory muscles.  Cardiac: RRR Vascular:  Minimal edema Vessel Right Left  Radial Palpable Palpable  Dorsalis Pedis Palpable Palpable  Posterior Tibial Palpable Palpable   Gastrointestinal: soft, non-distended. No guarding/no peritoneal signs.  Musculoskeletal: Arm in sling due to recent  surgery. No deformity or atrophy.  Neurologic: Pain and light touch intact in extremities.  Symmetrical.  Speech is fluent. Motor exam as listed above. Psychiatric: Judgment intact, Mood & affect appropriate for pt's clinical situation. Dermatologic: No Venous rashes. No Ulcers Noted.  No changes consistent with cellulitis. Lymph : No Cervical lymphadenopathy, no lichenification or skin changes of chronic lymphedema.       ASSESSMENT AND PLAN:  1. Acute deep vein thrombosis (DVT) of popliteal vein of right lower  extremity ([place]) Due to the fact that the patient had pretty extensive reactions to both Eliquis and Xarelto we will continue to keep the patient on Plavix for at least 3 months.  Patient is also advised to continue to take his Claritin on a daily basis as well as to use hydrocortisone cream as it begins to itch.  We will have the patient return in 3 months in order to check on the rash to ensure it resolves as well as to discuss stopping anticoagulation.  2. Essential hypertension Continue antihypertensive medications as already ordered, these medications have been reviewed and there are no changes at this time.   3. Impingement syndrome of left shoulder Being followed by patient's orthopedic surgeon.  However, patient was advised that if he ever needs another orthopedic surgery such as knee replacement or further shoulder surgery is an IVC filter may be indicated due to the fact that he has developed a DVT following the surgery.  Patient understands and currently has no plans for any future surgeries.   Current Outpatient Medications on File Prior to Visit  Medication Sig Dispense Refill  . acetaminophen (TYLENOL) 500 MG tablet Take 500 mg by mouth every 6 (six) hours as needed for moderate pain or headache.    Marland Kitchen aspirin EC 81 MG tablet Take 81 mg by mouth every 3 (three) days.    . clopidogrel (PLAVIX) 75 MG tablet     . fenofibrate (TRICOR) 145 MG tablet Take 145 mg by mouth at  bedtime.    Marland Kitchen lisinopril (ZESTRIL) 10 MG tablet Take 10 mg by mouth daily.    . Misc Natural Products (PROSTATE) CAPS Take 1 capsule by mouth 2 (two) times daily. Prosta-Strong    . Omega-3 Fatty Acids (OMEGA 3 PO) Take 1 capsule by mouth every other day.    . ondansetron (ZOFRAN) 4 MG tablet Take 1 tablet (4 mg total) by mouth every 8 (eight) hours as needed for nausea or vomiting. 30 tablet 0  . OVER THE COUNTER MEDICATION Take 7 capsules by mouth daily. Melaleuca peek performance heart health vitamin package    . OVER THE COUNTER MEDICATION Take 1 tablet by mouth at bedtime. Immuno shield otc supplement    . tamsulosin (FLOMAX) 0.4 MG CAPS capsule Take 0.4 mg by mouth at bedtime.    . Vitamin Mixture (ESTER-C PO) Take 1 tablet by mouth at bedtime.    Marland Kitchen ELIQUIS 5 MG TABS tablet     . oxyCODONE (OXY IR/ROXICODONE) 5 MG immediate release tablet Take 1 tablet (5 mg total) by mouth every 4 (four) hours as needed. (Patient not taking: Reported on 05/07/2019) 40 tablet 0  . Rivaroxaban 15 & 20 MG TBPK Take as directed on package: Start with one 15mg  tablet by mouth twice a day with food. On Day 22, switch to one 20mg  tablet once a day with food. (Patient not taking: Reported on 05/07/2019) 51 each 0   No current facility-administered medications on file prior to visit.     There are no Patient Instructions on file for this visit. No follow-ups on file.   Kris Hartmann, NP  This note was completed with Sales executive.  Any errors are purely unintentional.

## 2019-08-10 ENCOUNTER — Ambulatory Visit (INDEPENDENT_AMBULATORY_CARE_PROVIDER_SITE_OTHER): Payer: Federal, State, Local not specified - PPO | Admitting: Vascular Surgery

## 2019-08-10 ENCOUNTER — Encounter (INDEPENDENT_AMBULATORY_CARE_PROVIDER_SITE_OTHER): Payer: Self-pay | Admitting: Vascular Surgery

## 2019-08-10 ENCOUNTER — Other Ambulatory Visit: Payer: Self-pay

## 2019-08-10 VITALS — BP 147/80 | HR 52 | Resp 16 | Wt 218.6 lb

## 2019-08-10 DIAGNOSIS — I10 Essential (primary) hypertension: Secondary | ICD-10-CM

## 2019-08-10 DIAGNOSIS — M7542 Impingement syndrome of left shoulder: Secondary | ICD-10-CM | POA: Diagnosis not present

## 2019-08-10 DIAGNOSIS — I82431 Acute embolism and thrombosis of right popliteal vein: Secondary | ICD-10-CM
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
## 2019-08-10 NOTE — Assessment & Plan Note (Signed)
The patient has had resolution of his DVT.  At this point, 81 mg aspirin daily for risk reduction for future DVT would be all that is appropriate.  Compression stockings if he develops any swelling or postphlebitic symptoms.  I will see him back as needed.

## 2019-08-24 ENCOUNTER — Ambulatory Visit (INDEPENDENT_AMBULATORY_CARE_PROVIDER_SITE_OTHER): Payer: Medicare Other | Admitting: Vascular Surgery

## 2020-09-19 ENCOUNTER — Other Ambulatory Visit: Payer: Self-pay | Admitting: Orthopedic Surgery

## 2020-09-19 DIAGNOSIS — M25561 Pain in right knee: Secondary | ICD-10-CM

## 2020-09-21 ENCOUNTER — Other Ambulatory Visit: Payer: Self-pay

## 2020-09-21 ENCOUNTER — Ambulatory Visit
Admission: RE | Admit: 2020-09-21 | Discharge: 2020-09-21 | Disposition: A | Discharge status: Home / Self Care | Payer: No Typology Code available for payment source | Source: Ambulatory Visit | Attending: Orthopedic Surgery | Admitting: Orthopedic Surgery

## 2020-09-21 DIAGNOSIS — M25561 Pain in right knee: Secondary | ICD-10-CM | POA: Diagnosis not present

## 2020-10-24 ENCOUNTER — Other Ambulatory Visit (HOSPITAL_COMMUNITY): Payer: Self-pay | Admitting: Orthopedic Surgery

## 2020-10-24 ENCOUNTER — Other Ambulatory Visit: Payer: Self-pay | Admitting: Orthopedic Surgery

## 2020-10-24 DIAGNOSIS — R2241 Localized swelling, mass and lump, right lower limb: Secondary | ICD-10-CM

## 2020-10-25 ENCOUNTER — Ambulatory Visit
Admission: RE | Admit: 2020-10-25 | Discharge: 2020-10-25 | Disposition: A | Discharge status: Home / Self Care | Payer: No Typology Code available for payment source | Source: Ambulatory Visit | Attending: Orthopedic Surgery | Admitting: Orthopedic Surgery

## 2020-10-25 ENCOUNTER — Other Ambulatory Visit: Payer: Self-pay

## 2020-10-25 DIAGNOSIS — R2241 Localized swelling, mass and lump, right lower limb: Secondary | ICD-10-CM

## 2021-06-21 ENCOUNTER — Other Ambulatory Visit: Payer: Self-pay | Admitting: Internal Medicine

## 2021-06-21 ENCOUNTER — Other Ambulatory Visit (HOSPITAL_COMMUNITY): Payer: Self-pay | Admitting: Internal Medicine

## 2021-07-04 ENCOUNTER — Other Ambulatory Visit: Payer: Self-pay | Admitting: Internal Medicine

## 2021-07-04 ENCOUNTER — Other Ambulatory Visit (HOSPITAL_COMMUNITY): Payer: Self-pay | Admitting: Internal Medicine

## 2021-07-19 ENCOUNTER — Other Ambulatory Visit: Payer: Self-pay | Admitting: Internal Medicine

## 2021-07-30 ENCOUNTER — Other Ambulatory Visit: Payer: Self-pay | Admitting: Internal Medicine

## 2021-07-30 DIAGNOSIS — Z136 Encounter for screening for cardiovascular disorders: Secondary | ICD-10-CM

## 2021-08-07 ENCOUNTER — Other Ambulatory Visit: Payer: Self-pay

## 2021-08-07 ENCOUNTER — Ambulatory Visit
Admission: RE | Admit: 2021-08-07 | Discharge: 2021-08-07 | Disposition: A | Discharge status: Home / Self Care | Payer: No Typology Code available for payment source | Source: Ambulatory Visit | Attending: Internal Medicine | Admitting: Internal Medicine

## 2021-08-07 DIAGNOSIS — Z136 Encounter for screening for cardiovascular disorders: Secondary | ICD-10-CM | POA: Insufficient documentation

## 2022-06-17 ENCOUNTER — Encounter (INDEPENDENT_AMBULATORY_CARE_PROVIDER_SITE_OTHER): Payer: Self-pay

## 2022-10-26 IMAGING — MR MR KNEE*R* W/O CM
6 series · 40 of 40 positions shown · non-contrast
Comparison: None.

CLINICAL DATA: Right knee pain for 6 months

EXAM:
MRI OF THE RIGHT KNEE WITHOUT CONTRAST
TECHNIQUE: Multiplanar, multisequence MR imaging of the knee was performed. No
intravenous contrast was administered.

[Series 8: T2 fat-sat · axial · right · 4.0mm · 0.50mm/px · z∈[-72,+53]mm · 6 of 26 slices shown (1 of 3)]
[im 1/26]
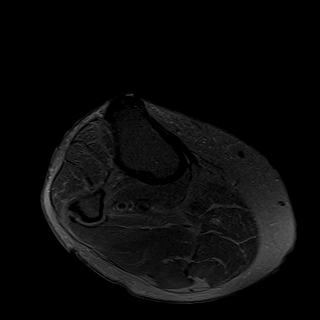
[im 6/26]
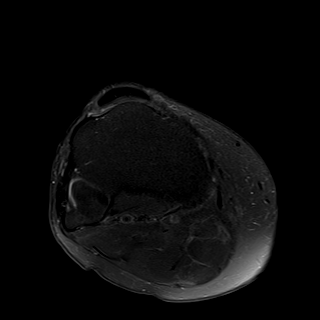
[im 11/26]
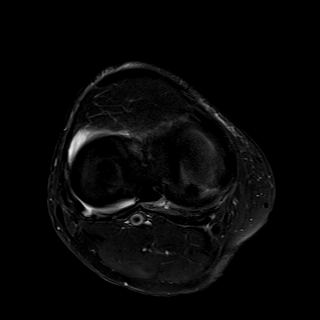
[im 16/26]
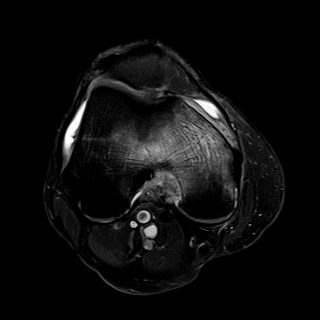
[im 21/26]
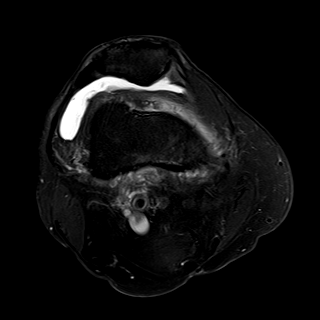
[im 26/26]
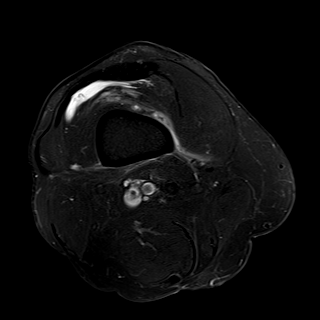

[Series 9: T2 fat-sat · coronal · right · 4.0mm · 0.59mm/px · 6 of 30 slices shown (2 of 3)]
[im 1/30]
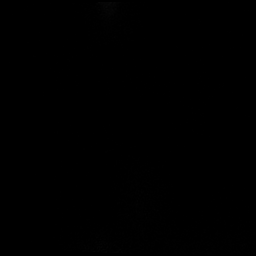
[im 6/30]
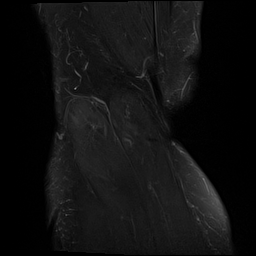
[im 12/30]
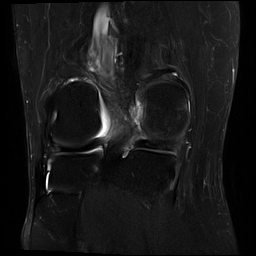
[im 18/30]
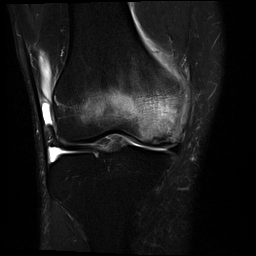
[im 24/30]
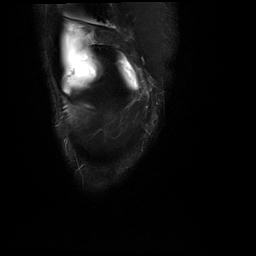
[im 30/30]
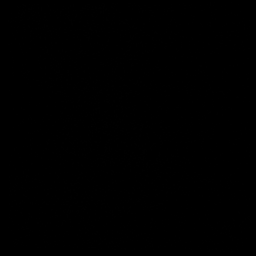

[Series 10: T1 · coronal · right · 4.0mm · 0.59mm/px · 6 of 29 slices shown]
[im 1/29]
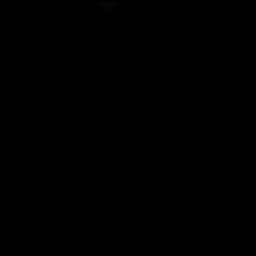
[im 6/29]
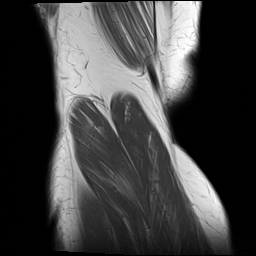
[im 12/29]
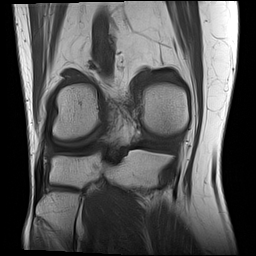
[im 17/29]
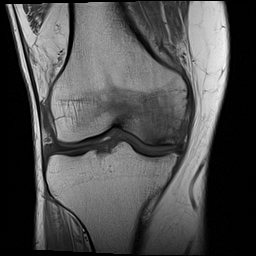
[im 23/29]
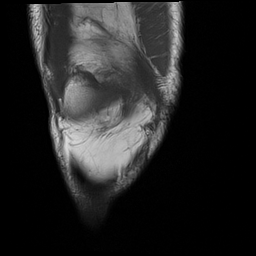
[im 29/29]
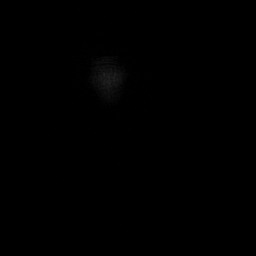

[Series 11: PD fat-sat · coronal · right · 4.0mm · 0.59mm/px · 6 of 29 slices shown (1 of 2)]
[im 1/29]
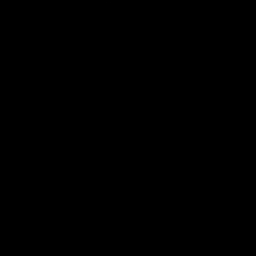
[im 6/29]
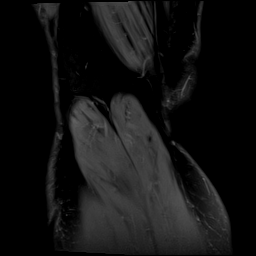
[im 12/29]
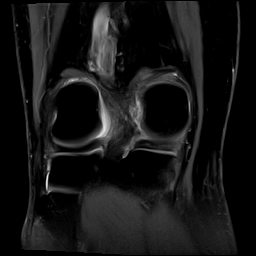
[im 17/29]
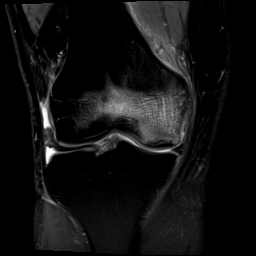
[im 23/29]
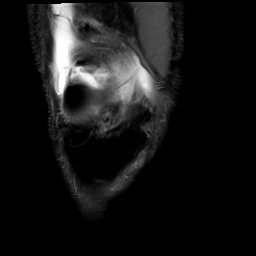
[im 29/29]
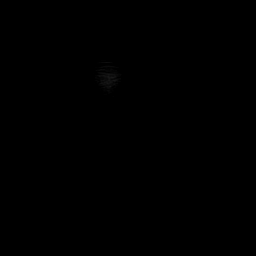

[Series 12: PD fat-sat · sagittal · right · 3.0mm · 0.59mm/px · 8 of 36 slices shown (2 of 2)]
[im 1/36]
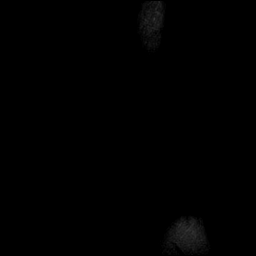
[im 6/36]
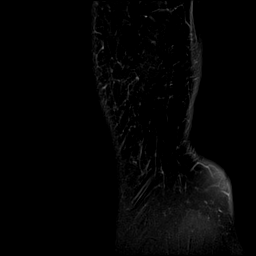
[im 11/36]
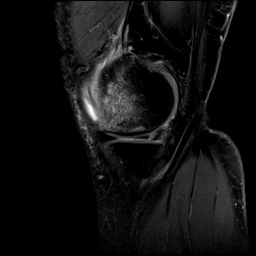
[im 16/36]
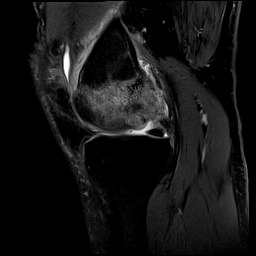
[im 21/36]
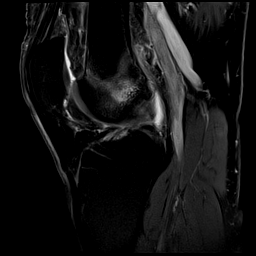
[im 26/36]
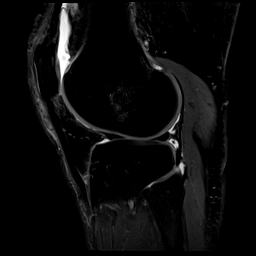
[im 31/36]
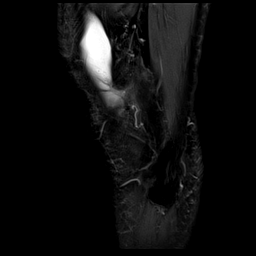
[im 36/36]
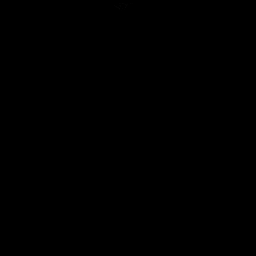

[Series 13: T2 fat-sat · sagittal · right · 3.0mm · 0.59mm/px · 8 of 36 slices shown (3 of 3)]
[im 1/36]
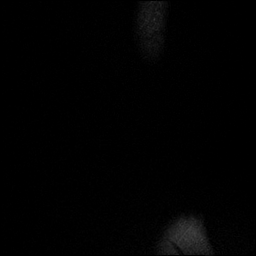
[im 6/36]
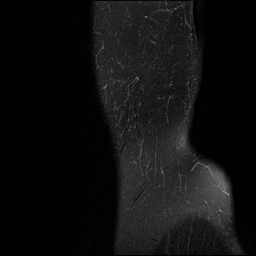
[im 11/36]
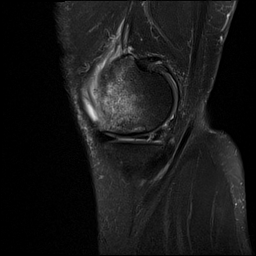
[im 16/36]
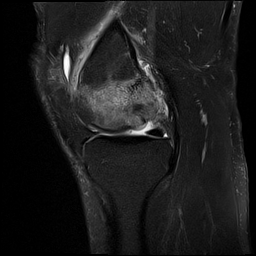
[im 21/36]
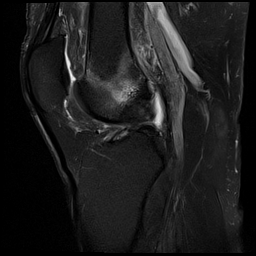
[im 26/36]
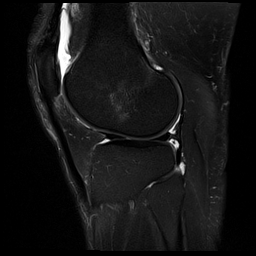
[im 31/36]
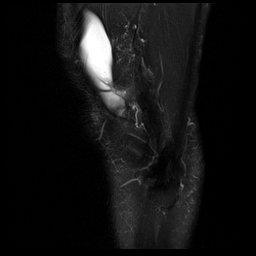
[im 36/36]
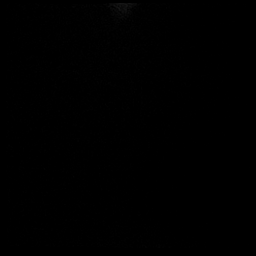

[40 of 40 positions shown; findings below may reference images not displayed]

FINDINGS: MENISCI

Medial meniscus: Irregular radial tear at the posterior horn-body
junction of the medial meniscus (series 12, images 25-27; series 8,
image 16). Meniscal body is medially extruded.

Lateral meniscus:  Intact.

LIGAMENTS

Cruciates: Intact ACL and PCL. Edema and synovitis within the
intercondylar notch.

Collaterals: Medial collateral ligament is intact. Lateral
collateral ligament complex is intact.

CARTILAGE

Patellofemoral: Partial-thickness chondral fissuring of the lateral
patellar facet. No trochlear chondral defect.

Medial: Partial-thickness chondral surface irregularity of the
medial femoral condyle along the posterior margin of the subchondral
fracture site.

Lateral:  No focal cartilage defect.

Joint: Small knee joint effusion. Edema within the prefemoral fat.
Minimal edema within Hoffa's fat.

Popliteal Fossa:  No Baker cyst. Intact popliteus tendon.

Extensor Mechanism:  Intact quadriceps tendon and patellar tendon.

Bones: Extensive subchondral fracture of the medial femoral condyle
with marked marrow edema throughout the medial femoral condyle.
Osseous structures are otherwise within normal limits. No additional
fracture. No malalignment. No suspicious bone lesion.

Other: None.
IMPRESSION: 1. Extensive subchondral fracture of the medial femoral condyle with
marked surrounding bone marrow edema.
2. Irregular radial tear at the posterior horn-body junction of the
medial meniscus.
3. Small knee joint effusion and focal synovitis within the
intercondylar notch.

These results will be called to the ordering clinician or
representative by the Radiologist Assistant, and communication
documented in the PACS or [REDACTED].

## 2022-11-12 ENCOUNTER — Other Ambulatory Visit: Payer: Self-pay

## 2022-11-12 DIAGNOSIS — E041 Nontoxic single thyroid nodule: Secondary | ICD-10-CM

## 2022-11-25 ENCOUNTER — Ambulatory Visit
Admission: RE | Admit: 2022-11-25 | Discharge: 2022-11-25 | Disposition: A | Discharge status: Home / Self Care | Payer: No Typology Code available for payment source | Source: Ambulatory Visit | Attending: Internal Medicine | Admitting: Internal Medicine

## 2022-11-25 DIAGNOSIS — E041 Nontoxic single thyroid nodule: Secondary | ICD-10-CM | POA: Diagnosis present
